# Patient Record
Sex: Female | Born: 1945 | Race: White | Hispanic: No | Marital: Married | Smoking: Former smoker
Health system: Southern US, Community
[De-identification: ages and names within clinical notes are randomized; demographics above are authoritative.]

## PROBLEM LIST (undated history)

## (undated) DIAGNOSIS — G4730 Sleep apnea, unspecified: Secondary | ICD-10-CM

## (undated) DIAGNOSIS — I499 Cardiac arrhythmia, unspecified: Secondary | ICD-10-CM

## (undated) DIAGNOSIS — H3530 Unspecified macular degeneration: Secondary | ICD-10-CM

## (undated) DIAGNOSIS — R519 Headache, unspecified: Secondary | ICD-10-CM

## (undated) DIAGNOSIS — C801 Malignant (primary) neoplasm, unspecified: Secondary | ICD-10-CM

## (undated) DIAGNOSIS — M1990 Unspecified osteoarthritis, unspecified site: Secondary | ICD-10-CM

## (undated) DIAGNOSIS — E0590 Thyrotoxicosis, unspecified without thyrotoxic crisis or storm: Secondary | ICD-10-CM

## (undated) DIAGNOSIS — T7840XA Allergy, unspecified, initial encounter: Secondary | ICD-10-CM

## (undated) DIAGNOSIS — Z8619 Personal history of other infectious and parasitic diseases: Secondary | ICD-10-CM

## (undated) DIAGNOSIS — I10 Essential (primary) hypertension: Secondary | ICD-10-CM

## (undated) DIAGNOSIS — T8859XA Other complications of anesthesia, initial encounter: Secondary | ICD-10-CM

## (undated) HISTORY — PX: MOHS SURGERY: SHX181

## (undated) HISTORY — PX: KNEE ARTHROSCOPY: SUR90

## (undated) HISTORY — DX: Allergy, unspecified, initial encounter: T78.40XA

## (undated) HISTORY — DX: Malignant (primary) neoplasm, unspecified: C80.1

## (undated) HISTORY — PX: DILATION AND CURETTAGE OF UTERUS: SHX78

## (undated) HISTORY — PX: APPENDECTOMY: SHX54

## (undated) HISTORY — PX: TONSILLECTOMY: SUR1361

## (undated) HISTORY — DX: Personal history of other infectious and parasitic diseases: Z86.19

---

## 1999-09-19 ENCOUNTER — Encounter (INDEPENDENT_AMBULATORY_CARE_PROVIDER_SITE_OTHER): Payer: Self-pay

## 1999-09-19 ENCOUNTER — Other Ambulatory Visit: Admission: RE | Admit: 1999-09-19 | Discharge: 1999-09-19 | Payer: Self-pay | Admitting: Gastroenterology

## 2003-07-13 ENCOUNTER — Ambulatory Visit (HOSPITAL_BASED_OUTPATIENT_CLINIC_OR_DEPARTMENT_OTHER): Admission: RE | Admit: 2003-07-13 | Discharge: 2003-07-13 | Payer: Self-pay | Admitting: Plastic Surgery

## 2003-07-13 ENCOUNTER — Encounter (INDEPENDENT_AMBULATORY_CARE_PROVIDER_SITE_OTHER): Payer: Self-pay | Admitting: *Deleted

## 2004-05-25 ENCOUNTER — Emergency Department (HOSPITAL_COMMUNITY): Admission: EM | Admit: 2004-05-25 | Discharge: 2004-05-25 | Payer: Self-pay | Admitting: Family Medicine

## 2008-02-10 ENCOUNTER — Emergency Department (HOSPITAL_COMMUNITY): Admission: EM | Admit: 2008-02-10 | Discharge: 2008-02-10 | Payer: Self-pay | Admitting: Family Medicine

## 2011-04-14 NOTE — Op Note (Signed)
Marissa Parker, Marissa Parker                              ACCOUNT NO.:  [id number]   MEDICAL RECORD NO.:  [id number]                   PATIENT TYPE:  AMB   LOCATION:  DSC                                  FACILITY:  [facility]   PHYSICIAN:  Brantley Persons, M.D.             DATE OF BIRTH:  1946/08/06   DATE OF PROCEDURE:  07/13/2003  DATE OF DISCHARGE:                                 OPERATIVE REPORT   PREOPERATIVE DIAGNOSIS:  Basal cell cancer, left nostril.   POSTOPERATIVE DIAGNOSIS:  Morpheaform basal cell cancer, left nostril.   OPERATION PERFORMED:  Excision of 1.2 cm morpheaform basal cell carcinoma,  left nostril with intraoperative frozen section diagnosis.   SURGEON:  Brantley Persons, M.D.   ANESTHESIA:  1% lidocaine with epinephrine.   COMPLICATIONS:  None.   INDICATIONS FOR PROCEDURE:  The patient is a 65 year old Caucasian female  who was referred by Marissa Parker for re-excision of a left nostril/nasal  alar basal cell cancer.  Marissa Parker performed a biopsy on May 28, 2003.  The  biopsy indicated that there is a basaloid proliferation with atypia that  needs re-excision.  She is therefore brought to the operating room to  undergo re-excision of this lesion with an intraoperative frozen section  diagnosis to check margins.   DESCRIPTION OF PROCEDURE:  The patient was brought to the procedure room and  placed on the table in supine position.  The nose and cheeks were prepped  with Betadine and draped in sterile fashion.  The skin and subcutaneous  tissues in the area of the healing biopsy site were injected with 1%  lidocaine with epinephrine.  After adequate anesthesia and hemostasis had  taken effect, the procedure was begun.  Using loupe magnification, the  borders of what appeared to be the healing biopsy site as well as any  residual basal cell cancer were identified.  At least 1mm margins were  marked circumferentially around this.  The specimen was then excised full  thickness through the skin and subcutaneous tissues and marked at the 12  o'clock position with a suture.  It was then passed off the table to undergo  an intraoperative frozen section diagnosis.  While I was on standby, Marissa Parker reviewed the specimen.  She indicated that basal cell cancer was  present at all the margins and asked for a re-excision of the margins.  I  therefore proceeded with another 2mm circumferential re-excision of all the  margins of the cancer.  A suture marked the 12 o'clock position and the  external margin was inked.  While I was on standby, the specimen was again  sent to the pathology lab for an intraoperative frozen section diagnosis.  Marissa Parker indicated that three of the margins were still involved.  She also  indicated that this was a morpheaform type of basal cell cancer that  actually was spreading in the layer below the epidermis.  I had told her the  skin I was looking at appeared to be normal and uninvolved.  In the course  of our discussion, we discussed how this may be rather extensive for the  patient.  Due to that, I think the most appropriate treatment for the  patient at this point would be to consider Moh's surgery for this lesion  since it is present on her nostril.  After discussing this with the patient,  we agreed that this would be done for her.  I therefore proceeded to close  the wound temporarily while she gets referred to a Moh's surgeon to have the  lesion excised.  We will also see what the final pathology specimens  indicate.  The skin edges were then undermined for easily closure.  The  dermal layer was closed using 5-0 Monocryl suture.  The skin was then closed  with a 6-0 Prolene running baseball type stitch.  Bacitracin ointment was  applied for a dressing.  There  were no complications.  The patient tolerated the procedure well.  She was  then given proper postoperative wound care instructions and discharged to  home in  stable condition.  Follow-up appointment will be tomorrow in the  office.  We will work with the patient to get her referral to a Teacher, early years/pre within her insurance network.                                                Brantley Persons, M.D.    MC/MEDQ  D:  07/13/2003  T:  07/14/2003  Job:  [id number]

## 2015-10-26 ENCOUNTER — Ambulatory Visit (INDEPENDENT_AMBULATORY_CARE_PROVIDER_SITE_OTHER): Payer: Medicare Other | Admitting: Allergy and Immunology

## 2015-10-26 ENCOUNTER — Encounter: Payer: Self-pay | Admitting: Allergy and Immunology

## 2015-10-26 VITALS — BP 160/102 | HR 80 | Temp 97.9°F | Resp 18 | Ht 62.99 in | Wt 163.1 lb

## 2015-10-26 DIAGNOSIS — T7800XA Anaphylactic reaction due to unspecified food, initial encounter: Secondary | ICD-10-CM | POA: Insufficient documentation

## 2015-10-26 DIAGNOSIS — Z91040 Latex allergy status: Secondary | ICD-10-CM | POA: Diagnosis not present

## 2015-10-26 DIAGNOSIS — G43009 Migraine without aura, not intractable, without status migrainosus: Secondary | ICD-10-CM | POA: Diagnosis not present

## 2015-10-26 MED ORDER — EPINEPHRINE 0.3 MG/0.3ML IJ SOAJ: 0.3000 mg | INTRAMUSCULAR | Status: AC | PRN

## 2015-10-26 NOTE — Progress Notes (Addendum)
[place]    NEW PATIENT NOTE  Referring Provider: No ref. provider found Primary Provider: Suzanna Obey, MD    Subjective:   Patient ID: Marissa Parker is a 69 y.o. female with a chief complaint of Pruritis  who presents to the clinic with the following problems:  HPI Comments:  Hand presents to this clinic on 10/26/2015 in evaluation of several issues. First off, she has noticed over the course of the past 26 years that whenever she eats eggs, specifically egg yolks she gets extreme cramping in her abdomen. She can eat baked eggs products with no problem at all but if she eats scrambled eggs or raw eggs in a mixture then she develops this problem. As well, she has noticed that she gets problems with a rash and abdominal cramping after eating banana and avocado. She also has problems with latex glove exposure. When be having a vaginal exam Playtex gloves she and developed intense itching and swelling and when having latex gloves worn by a dentist in her mouth she developed severe itching and swelling at areas of contact. If she wears a bra with elastic contained within it she will develop whelps at areas of contact within several minutes. She also has developed itchiness with using some different types of toilet paper. She can use any kind of scented toilet paper. She she misuses not full toilet paper and she does quite well and doing so. The triple antibiotic ointment is given rise to a rash. 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 She does drink caffeine on occasion. She also appears to have a problem with nasal congestion and sneezing for which she uses Claritin every day which works pretty well.   Past Medical History  Diagnosis Date  . History of shingles     eyes    Past Surgical History  Procedure Laterality Date  .  Tonsillectomy      age 21 and 28  . Mohs surgery      face and leg  . Dilation and curettage of uterus      Outpatient Encounter Prescriptions as of 10/26/2015  Medication Sig  . Ascorbic Acid (VITAMIN C) 500 MG CAPS Take by mouth.  Marland Kitchen aspirin 81 MG tablet Take 81 mg by mouth.  . clidinium-chlordiazePOXIDE (LIBRAX) 5-2.5 MG capsule   . Cyanocobalamin (VITAMIN B 12) 100 MCG LOZG Take by mouth.  . loratadine (CLARITIN) 10 MG tablet Take 10 mg by mouth daily.  . Magnesium Sulfate 70 MG CAPS Take by mouth.  . Potassium 75 MG TABS Take by mouth.  Francella Solian Johns Wort 150 MG CAPS Take by mouth.  . Turmeric 450 MG CAPS Take by mouth.  . vitamin E 400 UNIT capsule Take 400 Units by mouth.  . EPINEPHrine (EPIPEN 2-PAK) 0.3 mg/0.3 mL IJ SOAJ injection Inject 0.3 mLs (0.3 mg total) into the muscle as needed (life-threatening allergic reaction).   No facility-administered encounter medications on file as of 10/26/2015.    Meds ordered this encounter  Medications  . EPINEPHrine (EPIPEN 2-PAK) 0.3 mg/0.3 mL IJ SOAJ injection    Sig: Inject 0.3 mLs (0.3 mg total) into the muscle as needed (life-threatening allergic reaction).    Dispense:  2 Device    Refill:  2    Allergies  Allergen Reactions  . Latex Hives  . Bacitracin-Neomycin-Polymyxin Hives  .  Meperidine Nausea Only    Review of Systems  Constitutional: Negative for fever and chills.  HENT: Negative for congestion, ear pain, facial swelling, nosebleeds, postnasal drip, rhinorrhea, sinus pressure, sneezing, sore throat, tinnitus, trouble swallowing and voice change.   Eyes: Negative for pain, discharge, redness and itching.  Respiratory: Negative for cough, choking, chest tightness, shortness of breath, wheezing and stridor.   Cardiovascular: Negative for chest pain and leg swelling.  Gastrointestinal: Positive for abdominal distention. Negative for nausea, vomiting and abdominal pain.       History of lactose intolerance with the  development of GI cramping and a history of bloating treated with a gluten-free diet for the past 2 years successfully.  Endocrine: Negative for cold intolerance and heat intolerance.  Genitourinary: Negative for difficulty urinating.  Musculoskeletal: Negative.  Negative for myalgias and arthralgias.  Skin: Negative.   Allergic/Immunologic: Negative.   Neurological: Positive for headaches. Negative for dizziness.  Hematological: Negative for adenopathy.    Family History  Problem Relation Age of Onset  . Heart attack Mother   . Colon cancer Father     Social History   Social History  . Marital Status: Married    Spouse Name: N/A  . Number of Children: N/A  . Years of Education: N/A   Occupational History  . Not on file.   Social History Main Topics  . Smoking status: Former Smoker    Quit date: 11/27/1968  . Smokeless tobacco: Never Used  . Alcohol Use: Not on file  . Drug Use: Not on file  . Sexual Activity: Not on file   Other Topics Concern  . Not on file   Social History Narrative  . No narrative on file    Environmental and Social history  Lives in a house with a dry environment, no animals located inside the household, carpeting in the bedroom, sleeping on a stuff mattress without any plastic for the bed or pillows, and no smokers located inside the household. She is a Agricultural engineer.   Objective:   Filed Vitals:   10/26/15 1358  BP: 160/102  Pulse: 80  Temp: 97.9 F (36.6 C)  Resp: 18   Height: 5' 2.99" (160 cm) Weight: 163 lb 2.3 oz (74 kg)  Physical Exam  Constitutional: She appears well-developed and well-nourished. No distress.  HENT:  Head: Normocephalic and atraumatic. Head is without right periorbital erythema and without left periorbital erythema.  Right Ear: Tympanic membrane, external ear and ear canal normal. No drainage or tenderness. No foreign bodies. Tympanic membrane is not injected, not scarred, not perforated, not erythematous, not  retracted and not bulging. No middle ear effusion.  Left Ear: Tympanic membrane, external ear and ear canal normal. No drainage or tenderness. No foreign bodies. Tympanic membrane is not injected, not scarred, not perforated, not erythematous, not retracted and not bulging.  No middle ear effusion.  Nose: Nose normal. No mucosal edema, rhinorrhea, nose lacerations or sinus tenderness.  No foreign bodies.  Mouth/Throat: Oropharynx is clear and moist. No oropharyngeal exudate, posterior oropharyngeal edema, posterior oropharyngeal erythema or tonsillar abscesses.  Eyes: Lids are normal. Right eye exhibits no chemosis, no discharge and no exudate. No foreign body present in the right eye. Left eye exhibits no chemosis, no discharge and no exudate. No foreign body present in the left eye. Right conjunctiva is not injected. Left conjunctiva is not injected.  Neck: Neck supple. No tracheal tenderness present. No tracheal deviation and no edema present. No thyroid mass and no  thyromegaly present.  Cardiovascular: Normal rate, regular rhythm, S1 normal and S2 normal.  Exam reveals no gallop.   No murmur heard. Pulmonary/Chest: No accessory muscle usage or stridor. No respiratory distress. She has no wheezes. She has no rhonchi. She has no rales.  Abdominal: Soft. She exhibits no distension and no mass. There is no tenderness. There is no rebound and no guarding.  Musculoskeletal: She exhibits no edema or tenderness.  Lymphadenopathy:       Head (right side): No tonsillar adenopathy present.       Head (left side): No tonsillar adenopathy present.    She has no cervical adenopathy.  Neurological: She is alert.  Skin: No rash noted. She is not diaphoretic.  Psychiatric: She has a normal mood and affect. Her behavior is normal.    Diagnostics: None   Assessment and Plan:    1. Allergy with anaphylaxis due to food, initial encounter   2. Latex allergy   3. Migraine without aura and without status  migrainosus, not intractable      1. EpiPen, Benadryl, M.D./ER evaluation for allergic reaction  2. Avoid avocado, banana, chest nut, latex, and egg  3. Consolidate all forms of caffeine including chocolate use  4. Continue loratadine 10-20 milligrams daily  5. Contact clinic if problems in the future  And has several active issues revolving around her atopic disease and migraine. Her history is very consistent with the avocado/banana, latex hypersensitivity syndrome and she needs to avoid all of these agents in the future. As well, I did inform her about the overlap of this syndrome with chestnut allergy and she can avoid this food product as well. As well, her history is quite consistent with egg allergy and we'll have her avoid this food product as well. We did give her an EpiPen and informed her about how to use this agent should she ever develop a significant reaction to food in the future. Concerning her migraine headaches, I've informed her that she needs to come off all forms of caffeine and chocolate use as this will probably help the intensity and frequency of her headache. We will see her back in this clinic as needed.    Allena Katz, MD [place]

## 2015-10-26 NOTE — Patient Instructions (Signed)
  1. EpiPen, Benadryl, M.D./ER evaluation for allergic reaction  2. Avoid avocado, banana, chest nut, latex, and egg  3. Consolidate all forms of caffeine including chocolate use\  4. Continue loratadine 10-20 milligrams daily  5. Contact clinic if problems in the future

## 2015-11-23 ENCOUNTER — Encounter: Payer: Self-pay | Admitting: Internal Medicine

## 2016-01-20 ENCOUNTER — Encounter: Payer: Self-pay | Admitting: Internal Medicine

## 2016-01-20 ENCOUNTER — Ambulatory Visit (INDEPENDENT_AMBULATORY_CARE_PROVIDER_SITE_OTHER): Payer: Medicare Other | Admitting: Internal Medicine

## 2016-01-20 VITALS — BP 128/90 | HR 88 | Ht 62.0 in | Wt 162.2 lb

## 2016-01-20 DIAGNOSIS — Z800 Family history of malignant neoplasm of digestive organs: Secondary | ICD-10-CM | POA: Diagnosis not present

## 2016-01-20 DIAGNOSIS — R1084 Generalized abdominal pain: Secondary | ICD-10-CM | POA: Diagnosis not present

## 2016-01-20 DIAGNOSIS — K219 Gastro-esophageal reflux disease without esophagitis: Secondary | ICD-10-CM

## 2016-01-20 MED ORDER — NA SULFATE-K SULFATE-MG SULF 17.5-3.13-1.6 GM/177ML PO SOLN: 1.0000 | Freq: Once | ORAL | Status: DC

## 2016-01-20 NOTE — Progress Notes (Signed)
HISTORY OF PRESENT ILLNESS:  Marissa Parker is a 70 y.o. female , wife of Analiah Goodell, who is referred by her friend (my patient, Velna Ochs) regarding abdominal complaints, reflux complaints, concern over abdominal aneurysm, family history of ulcers, and family history of colon cancer. The patient reports that she was experiencing difficulties with intestinal gas and abdominal discomfort last summer. The issues occurred intermittently. She self treated with probiotics which helped. She has self imposed gluten and Rice restriction which she feels helps her GI tract. However, she will continue to get abdominal discomfort intermittently. Described as lower and cramping. Previously prescribed Librax for irritable bowel type complaints. She does use this on demand with relief. She tells me that her mother had abdominal aortic aneurysm and requests ultrasound. Also her sister with a history of ulcer disease. The patient herself takes daily baby aspirin as well as NSAIDs intermittently but is not on PPI. No upper abdominal complaints except for intermittent GERD symptoms. She does have belching and bloating and gas. Bowel habits have been regular without bleeding. Last colonoscopy in 2000 with Dr. Verl Blalock revealed hyperplastic polyps only. She has been reluctant to undergo follow-up colonoscopy due to bowel prep difficulties. She denies dysphagia. She has had 60 pound weight loss within the past 5 years secondary to [employer].  REVIEW OF SYSTEMS:  All non-GI ROS negative except for back pain, headaches, itching, muscle cramps, muscle pain, skin rash  Past Medical History  Diagnosis Date  . History of shingles     eyes    Past Surgical History  Procedure Laterality Date  . Tonsillectomy      age 89 and 46  . Mohs surgery      face and leg  . Dilation and curettage of uterus      Social History GRACEN BEDIENT  reports that she quit smoking about 47 years ago. She has never used smokeless  tobacco. Her alcohol and drug histories are not on file.  family history includes Aneurysm in her mother; Colon cancer in her father; Heart attack in her mother.  Allergies  Allergen Reactions  . Latex Hives  . Adhesive [Tape]   . Bacitracin-Neomycin-Polymyxin Hives  . Banana   . Gluten Meal   . Lactose Intolerance (Gi)   . Other Hives  . Rice   . Diazepam Nausea Only  . Meperidine Nausea Only       PHYSICAL EXAMINATION: Vital signs: BP 128/90 mmHg  Pulse 88  Ht 5\' 2"  (1.575 m)  Wt 162 lb 3.2 oz (73.573 kg)  BMI 29.66 kg/m2  Constitutional: generally well-appearing, no acute distress Psychiatric: alert and oriented x3, cooperative Eyes: extraocular movements intact, anicteric, conjunctiva pink Mouth: oral pharynx moist, no lesions Neck: supple no lymphadenopathy Cardiovascular: heart regular rate and rhythm, no murmur Lungs: clear to auscultation bilaterally Abdomen: soft, nontender, nondistended, no obvious ascites, no peritoneal signs, normal bowel sounds, no organomegaly. Aorta palpable but no bruit Rectal: Deferred until colonoscopy Extremities: no clubbing cyanosis or lower extremity edema bilaterally Skin: no lesions on visible extremities Neuro: No focal deficits. Cranial nerves intact  ASSESSMENT:  #1. GERD without alarm features #2. Family history of gastric ulcer in patient on chronic NSAIDs #3. Family history of colon cancer in first-degree relative (father at 84); well overdue for follow-up screening #4. Generalized abdominal discomfort and issues with intestinal gas intermittently. Seemingly improved with probiotics and dietary measures #5. Prior history of "IBS". Doubt. Does take Librax periodically for "abdominal spasm discomfort"  PLAN:  #1. Reflux precautions #2. Upper endoscopy to evaluate GERD, abdominal discomfort, and rule out ulcer given chronic NSAID use.The nature of the procedure, as well as the risks, benefits, and alternatives were  carefully and thoroughly reviewed with the patient. Ample time for discussion and questions allowed. The patient understood, was satisfied, and agreed to proceed. #3. Screening colonoscopy.The nature of the procedure, as well as the risks, benefits, and alternatives were carefully and thoroughly reviewed with the patient. Ample time for discussion and questions allowed. The patient understood, was satisfied, and agreed to proceed. #4. Abdominal ultrasound to evaluate abdominal discomfort and rule out aneurysm

## 2016-01-20 NOTE — Patient Instructions (Signed)
You have been scheduled for an abdominal ultrasound at [facility] (1st floor of hospital) on 01/24/2016 at 9:30am. Please arrive 15 minutes prior to your appointment for registration. Make certain not to have anything to eat or drink 6 hours prior to your appointment. Should you need to reschedule your appointment, please contact radiology at [phone]. This test typically takes about 30 minutes to perform.   You have been scheduled for an endoscopy and colonoscopy. Please follow the written instructions given to you at your visit today. Please pick up your prep supplies at the pharmacy within the next 1-3 days. If you use inhalers (even only as needed), please bring them with you on the day of your procedure.

## 2016-01-24 ENCOUNTER — Ambulatory Visit (HOSPITAL_COMMUNITY)
Admission: RE | Admit: 2016-01-24 | Discharge: 2016-01-24 | Disposition: A | Discharge status: Home / Self Care | Payer: Medicare Other | Source: Ambulatory Visit | Attending: Internal Medicine | Admitting: Internal Medicine

## 2016-01-24 DIAGNOSIS — R1084 Generalized abdominal pain: Secondary | ICD-10-CM | POA: Insufficient documentation

## 2016-01-24 DIAGNOSIS — K219 Gastro-esophageal reflux disease without esophagitis: Secondary | ICD-10-CM | POA: Insufficient documentation

## 2016-01-24 DIAGNOSIS — Z800 Family history of malignant neoplasm of digestive organs: Secondary | ICD-10-CM | POA: Diagnosis not present

## 2016-02-24 ENCOUNTER — Ambulatory Visit (AMBULATORY_SURGERY_CENTER): Payer: Medicare Other | Admitting: Internal Medicine

## 2016-02-24 ENCOUNTER — Encounter: Payer: Self-pay | Admitting: Internal Medicine

## 2016-02-24 VITALS — BP 105/65 | HR 69 | Temp 98.7°F | Resp 22 | Ht 62.0 in | Wt 162.0 lb

## 2016-02-24 DIAGNOSIS — K219 Gastro-esophageal reflux disease without esophagitis: Secondary | ICD-10-CM

## 2016-02-24 DIAGNOSIS — Z1211 Encounter for screening for malignant neoplasm of colon: Secondary | ICD-10-CM | POA: Diagnosis not present

## 2016-02-24 DIAGNOSIS — K253 Acute gastric ulcer without hemorrhage or perforation: Secondary | ICD-10-CM

## 2016-02-24 DIAGNOSIS — Z800 Family history of malignant neoplasm of digestive organs: Secondary | ICD-10-CM

## 2016-02-24 MED ORDER — SODIUM CHLORIDE 0.9 % IV SOLN
500.0000 mL | INTRAVENOUS | Status: DC
Start: 2016-02-24 — End: 2016-02-24

## 2016-02-24 NOTE — Op Note (Signed)
[place] Patient Name: Marissa Parker Procedure Date: 02/24/2016 1:46 PM MRN: [id number] Endoscopist: Docia Chuck. Henrene Pastor , MD Age: 70 Referring MD:  Date of Birth: 1946/10/30 Gender: Female Procedure:                Upper GI endoscopy with biopsy Indications:              Suspected esophageal reflux Medicines:                Monitored Anesthesia Care Procedure:                Pre-Anesthesia Assessment:                           - Prior to the procedure, a History and Physical                            was performed, and patient medications and                            allergies were reviewed. The patient's tolerance of                            previous anesthesia was also reviewed. The risks                            and benefits of the procedure and the sedation                            options and risks were discussed with the patient.                            All questions were answered, and informed consent                            was obtained. Prior Anticoagulants: The patient has                            taken no previous anticoagulant or antiplatelet                            agents. ASA Grade Assessment: II - A patient with                            mild systemic disease. After reviewing the risks                            and benefits, the patient was deemed in                            satisfactory condition to undergo the procedure.                           After obtaining informed consent, the endoscope was  passed under direct vision. Throughout the                            procedure, the patient's blood pressure, pulse, and                            oxygen saturations were monitored continuously. The                            Model [id number]-[id number] scope was introduced                            through the mouth, and advanced to the second part                            of duodenum. The upper GI endoscopy was                           accomplished without difficulty. The patient                            tolerated the procedure well. Scope In: Scope Out: Findings:      Non-severe esophagitis was found in the distal esophagus. The esophagus       was otherwise normal.      Patchy mild inflammation was found in the gastric antrum. Biopsies were       taken with a cold forceps for Helicobacter pylori testing using CLOtest.      The exam of the stomach was otherwise normal. Retroflexed view was       negative.      The examined duodenum was normal. Complications:            No immediate complications. Estimated Blood Loss:     Estimated blood loss: none. Impression:               - GERD with mild esophagitis                           - Antral gastritis likely secondary to NSAIDs.                            Status post CLO biopsy. Recommendation:           - Consider Prilosec OTC 20 mg daily for acid reflux                            and protection of gastric mucosa from NSAIDs.                           - Resume previous diet.                           - Continue present medications otherwise. Procedure Code(s):        --- Professional ---                           [phone], Esophagogastroduodenoscopy, flexible,  transoral; with biopsy, single or multiple CPT copyright 2016 American Medical Association. All rights reserved. Docia Chuck. Henrene Pastor, MD 02/24/2016 2:29:22 PM This report has been signed electronically. Number of Addenda: 0 Referring MD:      Suzanna Obey

## 2016-02-24 NOTE — Op Note (Signed)
[place] [place] Patient Name: Marissa Parker Procedure Date: 02/24/2016 1:46 PM MRN: [id number] Endoscopist: Docia Chuck. Henrene Pastor , MD Age: 70 Referring MD:  Date of Birth: 06-15-1946 Gender: Female Procedure:                Colonoscopy Indications:              Screening for colorectal malignant neoplasm.                            Previous examination 2000 (Dr. Sharlett Iles) with                            hyperplastic polyp Medicines:                Monitored Anesthesia Care Procedure:                Pre-Anesthesia Assessment:                           - Prior to the procedure, a History and Physical                            was performed, and patient medications and                            allergies were reviewed. The patient's tolerance of                            previous anesthesia was also reviewed. The risks                            and benefits of the procedure and the sedation                            options and risks were discussed with the patient.                            All questions were answered, and informed consent                            was obtained. Prior Anticoagulants: The patient has                            taken no previous anticoagulant or antiplatelet                            agents. ASA Grade Assessment: II - A patient with                            mild systemic disease. After reviewing the risks                            and benefits, the patient was deemed in  satisfactory condition to undergo the procedure.                           After obtaining informed consent, the colonoscope                            was passed under direct vision. Throughout the                            procedure, the patient's blood pressure, pulse, and                            oxygen saturations were monitored continuously. The                            Model CF-HQ190L [id number]) scope was introduced   through the anus and advanced to the the cecum,                            identified by appendiceal orifice and ileocecal                            valve. The colonoscopy was performed without                            difficulty. The patient tolerated the procedure                            well. The quality of the bowel preparation was                            excellent. The bowel preparation used was SUPREP.                            The ileocecal valve, appendiceal orifice, and                            rectum were photographed. Scope In: 1:55:10 PM Scope Out: 2:06:58 PM Scope Withdrawal Time: 0 hours 8 minutes 40 seconds  Total Procedure Duration: 0 hours 11 minutes 48 seconds  Findings:      The digital rectal exam was normal.      Multiple diverticula were found in the sigmoid colon.      The exam was otherwise without abnormality on direct and retroflexion       views. Complications:            No immediate complications. Estimated Blood Loss:     Estimated blood loss: none. Impression:               - Diverticulosis in the sigmoid colon.                           - The examination was otherwise normal on direct                            and retroflexion views.                           -  No specimens collected. Recommendation:           - Patient has a contact number available for                            emergencies. The signs and symptoms of potential                            delayed complications were discussed with the                            patient. Return to normal activities tomorrow.                            Written discharge instructions were provided to the                            patient.                           - Resume previous diet.                           - Continue present medications.                           - Repeat colonoscopy in 10 years for screening                            purposes. Procedure Code(s):        ---  Professional ---                           [id number], Colonoscopy, flexible; diagnostic, including                            collection of specimen(s) by brushing or washing,                            when performed (separate procedure) CPT copyright 2016 [employer]. All rights reserved. Docia Chuck. Henrene Pastor, MD 02/24/2016 2:21:58 PM This report has been signed electronically. Number of Addenda: 0 CC Letter to:             Dr. Suzanna Obey Referring MD:      Suzanna Obey

## 2016-02-24 NOTE — Progress Notes (Signed)
Notified Dr. Henrene Pastor & CRNA, Tommi Rumps of pt's Egg Yolk allergy.

## 2016-02-24 NOTE — Patient Instructions (Signed)

## 2016-02-24 NOTE — Progress Notes (Signed)
Called to room to assist during endoscopic procedure.  Patient ID and intended procedure confirmed with present staff. Received instructions for my participation in the procedure from the performing physician.  

## 2016-02-24 NOTE — Progress Notes (Signed)
Report given to RN, vss

## 2016-02-25 ENCOUNTER — Telehealth: Payer: Self-pay | Admitting: Emergency Medicine

## 2016-02-25 NOTE — Telephone Encounter (Signed)
  Follow up Call-  Call back number 02/24/2016  Post procedure Call Back phone  # [phone]  Permission to leave phone message Yes     Patient questions:  Do you have a fever, pain , or abdominal swelling? No. Pain Score  0 *  Have you tolerated food without any problems? Yes.    Have you been able to return to your normal activities? Yes.    Do you have any questions about your discharge instructions: Diet   No. Medications  No. Follow up visit  No.  Do you have questions or concerns about your Care? No.  Actions: * If pain score is 4 or above: No action needed, pain <4.

## 2016-02-28 LAB — HELICOBACTER PYLORI SCREEN-BIOPSY: UREASE: NEGATIVE

## 2016-03-14 IMAGING — US US ABDOMEN COMPLETE
1 series · 13 of 25 positions shown · non-contrast
Comparison: None in PACs

CLINICAL DATA: Six months of abdominal pain, evaluate for possible
abdominal aortic aneurysm.

EXAM:
ABDOMEN ULTRASOUND COMPLETE

[Series 1: us abdomen complete · 0.22mm/px · 13 of 70 slices shown]
[im 1/70]
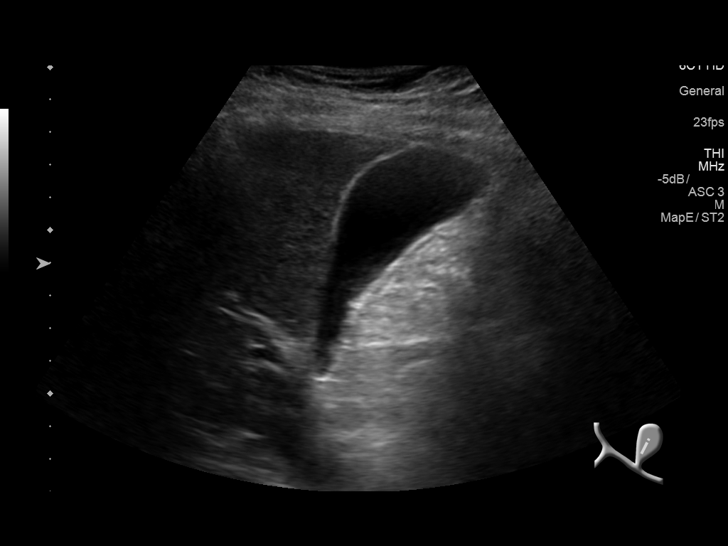
[im 6/70]
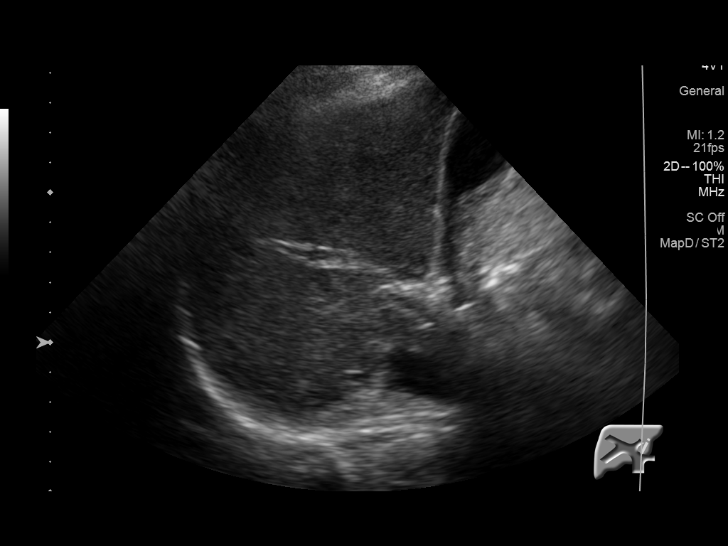
[im 12/70]
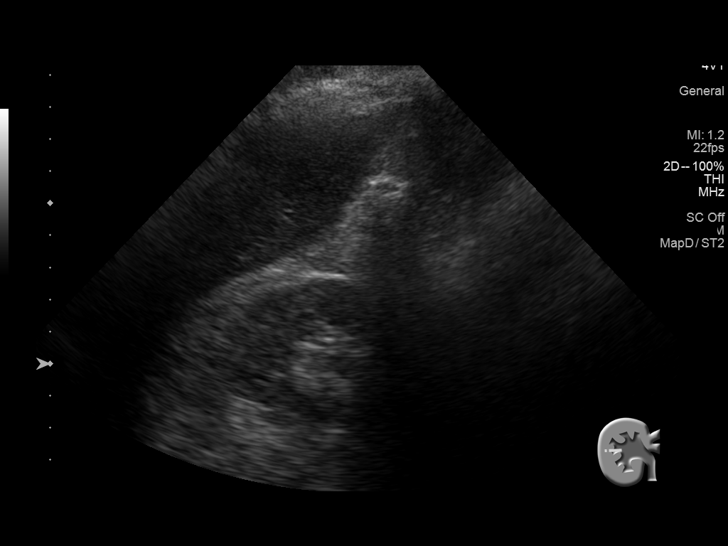
[im 18/70]
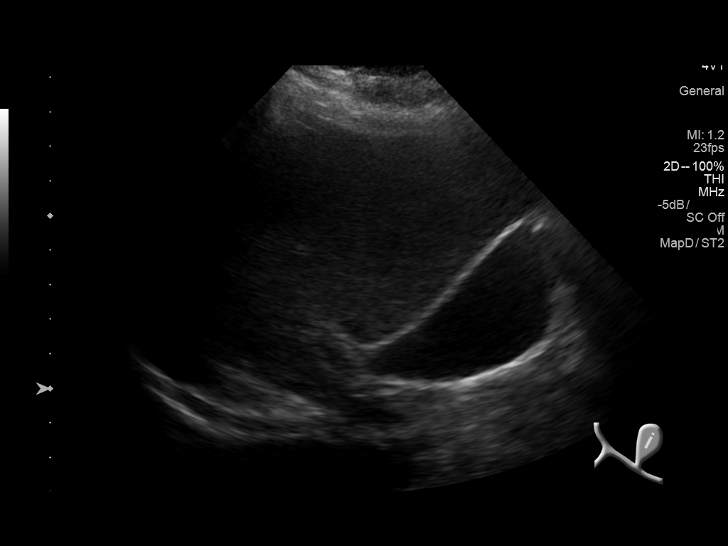
[im 24/70]
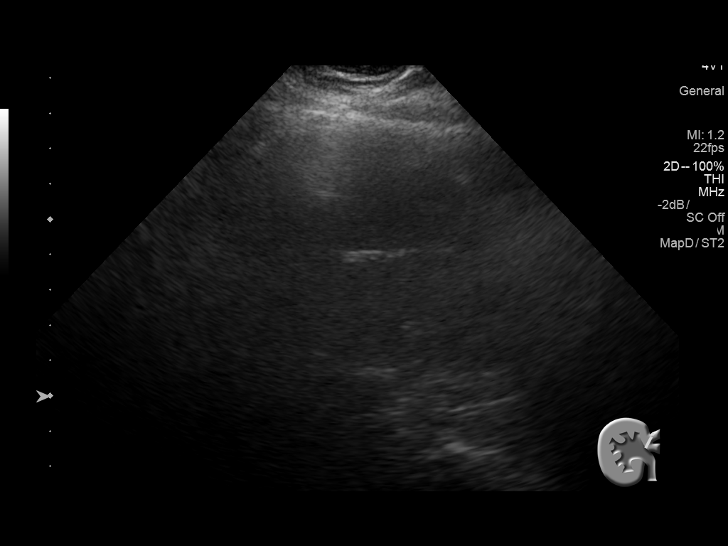
[im 29/70]
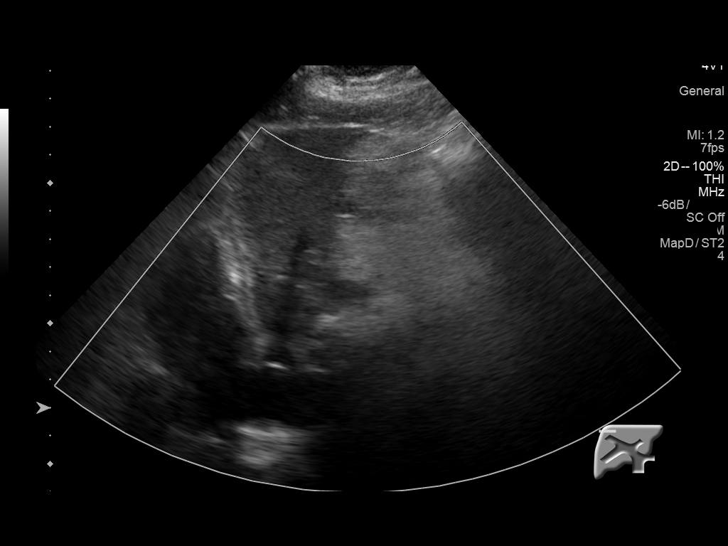
[im 35/70]
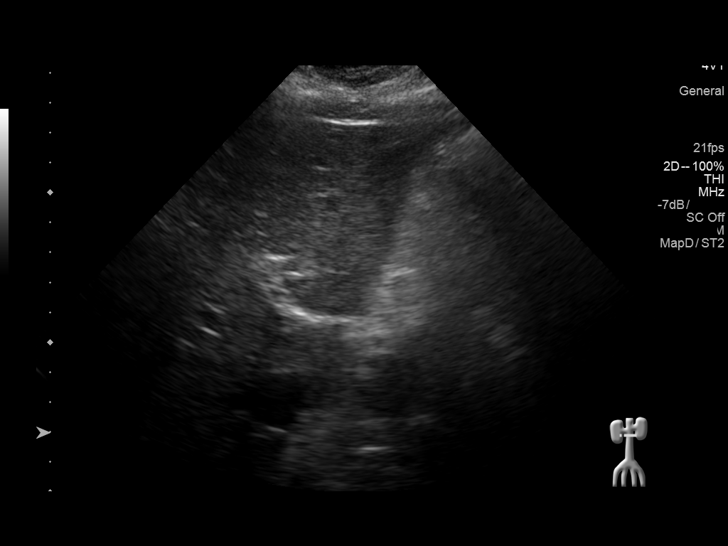
[im 41/70]
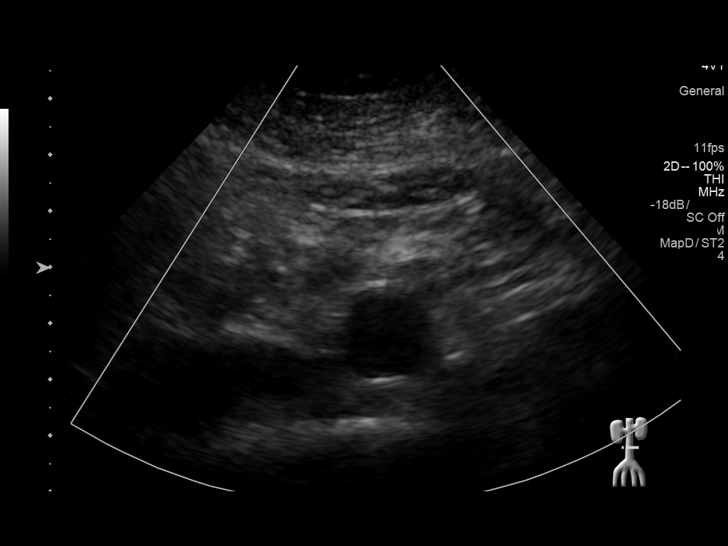
[im 47/70]
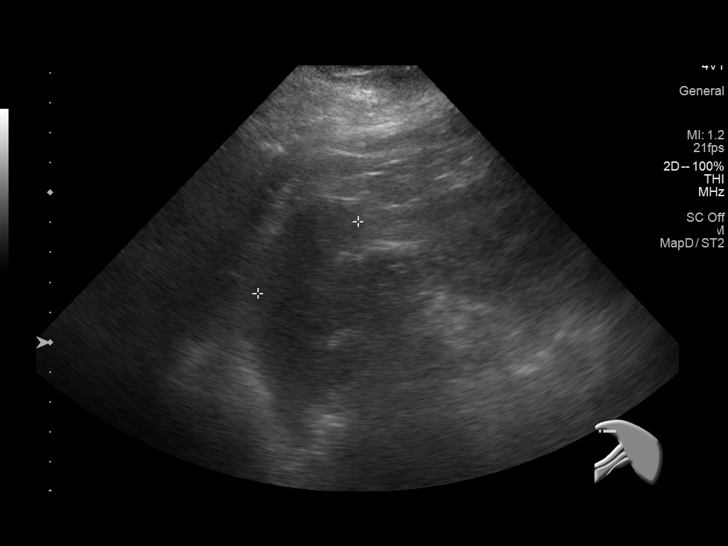
[im 52/70]
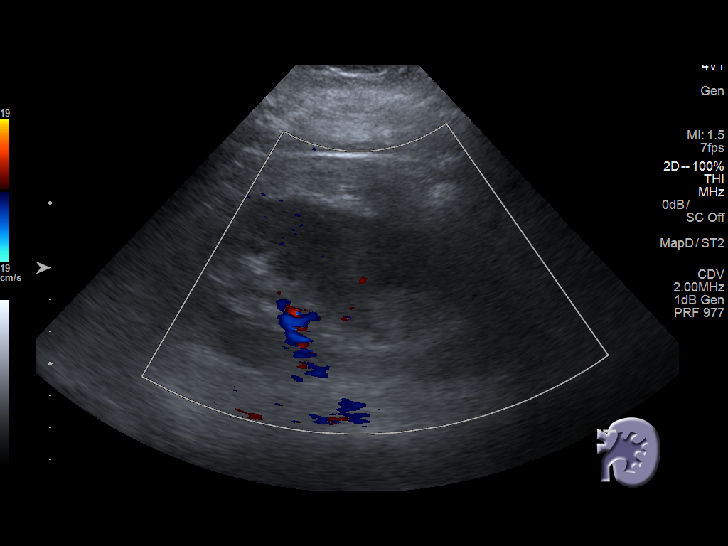
[im 58/70]
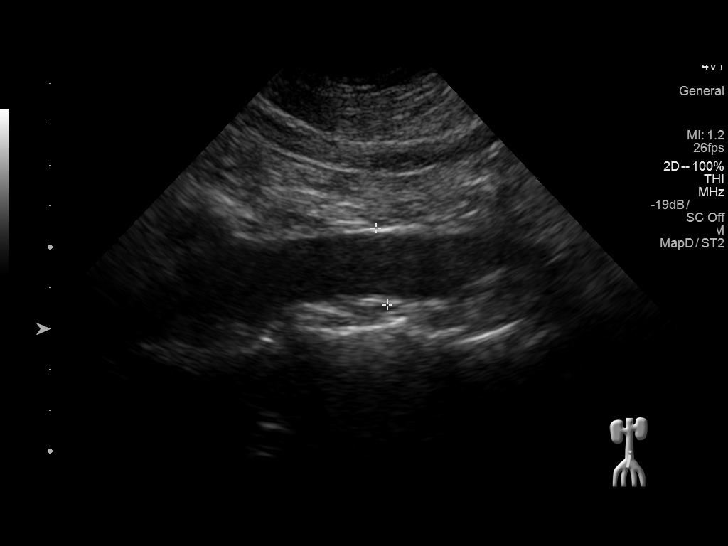
[im 64/70]
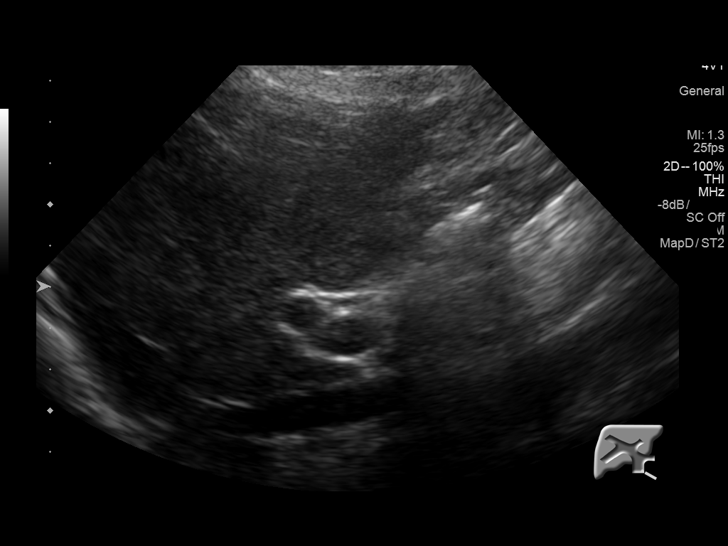
[im 70/70]
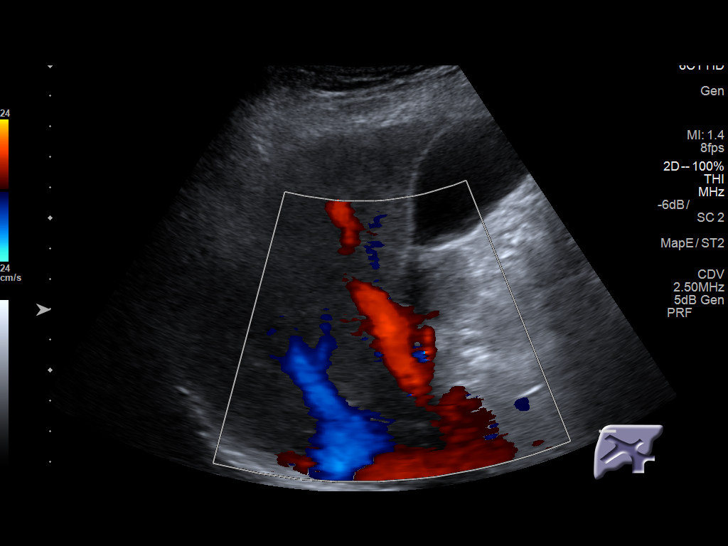

[13 of 25 positions shown; findings below may reference images not displayed]

FINDINGS: The study is limited due to the patient's body habitus and excessive
bowel gas.

Gallbladder: The gallbladder is adequately distended with no
evidence of stones, wall thickening, or pericholecystic fluid. There
is no positive sonographic Murphy's sign.

Common bile duct: Diameter: 3 mm

Liver: The hepatic echotexture is normal. There is no focal mass.
The surface contour is normal. There is no intrahepatic ductal
dilation.

IVC: The visualized portion is unremarkable.

Pancreas: The pancreas was largely obscured by bowel gas. Only a
small portion of the body was observed. Next item the visualized
portions of the spleen are normal.

Spleen: The visualized portions of the spleen are normal.

Right Kidney: Length: 10.4 cm. The renal cortical echotexture is
normal. There is no hydronephrosis nor focal mass.

Left Kidney: Length: 10.7 cm. The left kidney was partially obscured
by bowel gas. The visualized portion is unremarkable..

Abdominal aorta: The abdominal aorta exhibits a normal caliber with
the proximal aortic dimension of 2.2 cm, mid aortic dimension of
cm, and distal aortic dimension of 1.9 cm.

Other findings: None.
IMPRESSION: 1. No acute hepatobiliary abnormality. Limited visualization of the
pancreas and spleen.
2. No acute renal abnormality observed. Visualization of the left
kidney was limited.
3. No abdominal aortic aneurysm is observed.

## 2022-10-03 ENCOUNTER — Encounter (HOSPITAL_COMMUNITY): Payer: Self-pay

## 2022-10-03 DIAGNOSIS — I2699 Other pulmonary embolism without acute cor pulmonale: Secondary | ICD-10-CM

## 2022-10-03 HISTORY — DX: Other pulmonary embolism without acute cor pulmonale: I26.99

## 2022-10-04 ENCOUNTER — Inpatient Hospital Stay (HOSPITAL_COMMUNITY)
Admission: RE | Admit: 2022-10-04 | Discharge: 2022-10-08 | DRG: 643 | Disposition: A | Discharge status: Home / Self Care | Payer: Medicare Other | Source: Other Acute Inpatient Hospital | Attending: Internal Medicine | Admitting: Internal Medicine

## 2022-10-04 DIAGNOSIS — Z87891 Personal history of nicotine dependence: Secondary | ICD-10-CM

## 2022-10-04 DIAGNOSIS — Z7901 Long term (current) use of anticoagulants: Secondary | ICD-10-CM | POA: Diagnosis not present

## 2022-10-04 DIAGNOSIS — F419 Anxiety disorder, unspecified: Secondary | ICD-10-CM | POA: Diagnosis present

## 2022-10-04 DIAGNOSIS — Z91040 Latex allergy status: Secondary | ICD-10-CM

## 2022-10-04 DIAGNOSIS — Z8619 Personal history of other infectious and parasitic diseases: Secondary | ICD-10-CM | POA: Diagnosis not present

## 2022-10-04 DIAGNOSIS — Z888 Allergy status to other drugs, medicaments and biological substances status: Secondary | ICD-10-CM

## 2022-10-04 DIAGNOSIS — Z79899 Other long term (current) drug therapy: Secondary | ICD-10-CM

## 2022-10-04 DIAGNOSIS — E0591 Thyrotoxicosis, unspecified with thyrotoxic crisis or storm: Principal | ICD-10-CM | POA: Diagnosis present

## 2022-10-04 DIAGNOSIS — I2694 Multiple subsegmental pulmonary emboli without acute cor pulmonale: Secondary | ICD-10-CM | POA: Diagnosis not present

## 2022-10-04 DIAGNOSIS — Z91048 Other nonmedicinal substance allergy status: Secondary | ICD-10-CM

## 2022-10-04 DIAGNOSIS — I4891 Unspecified atrial fibrillation: Secondary | ICD-10-CM | POA: Diagnosis not present

## 2022-10-04 DIAGNOSIS — G4733 Obstructive sleep apnea (adult) (pediatric): Secondary | ICD-10-CM | POA: Diagnosis present

## 2022-10-04 DIAGNOSIS — Z91012 Allergy to eggs: Secondary | ICD-10-CM

## 2022-10-04 DIAGNOSIS — Z85828 Personal history of other malignant neoplasm of skin: Secondary | ICD-10-CM

## 2022-10-04 DIAGNOSIS — I4819 Other persistent atrial fibrillation: Secondary | ICD-10-CM

## 2022-10-04 DIAGNOSIS — I2782 Chronic pulmonary embolism: Secondary | ICD-10-CM | POA: Diagnosis present

## 2022-10-04 DIAGNOSIS — I2699 Other pulmonary embolism without acute cor pulmonale: Secondary | ICD-10-CM | POA: Diagnosis not present

## 2022-10-04 DIAGNOSIS — Z8249 Family history of ischemic heart disease and other diseases of the circulatory system: Secondary | ICD-10-CM

## 2022-10-04 DIAGNOSIS — Z91018 Allergy to other foods: Secondary | ICD-10-CM | POA: Diagnosis not present

## 2022-10-04 DIAGNOSIS — E0590 Thyrotoxicosis, unspecified without thyrotoxic crisis or storm: Secondary | ICD-10-CM | POA: Diagnosis not present

## 2022-10-04 DIAGNOSIS — Z800 Family history of malignant neoplasm of digestive organs: Secondary | ICD-10-CM

## 2022-10-04 LAB — COMPREHENSIVE METABOLIC PANEL
ALT: 40 U/L (ref 0–44)
AST: 43 U/L — ABNORMAL HIGH (ref 15–41)
Albumin: 2.5 g/dL — ABNORMAL LOW (ref 3.5–5.0)
Alkaline Phosphatase: 85 U/L (ref 38–126)
Anion gap: 10 (ref 5–15)
BUN: 14 mg/dL (ref 8–23)
CO2: 24 mmol/L (ref 22–32)
Calcium: 9.4 mg/dL (ref 8.9–10.3)
Chloride: 106 mmol/L (ref 98–111)
Creatinine, Ser: 0.45 mg/dL (ref 0.44–1.00)
GFR, Estimated: >60 mL/min (ref >60)
Glucose, Bld: 119 mg/dL — ABNORMAL HIGH (ref 70–99)
Potassium: 3.9 mmol/L (ref 3.5–5.1)
Sodium: 140 mmol/L (ref 135–145)
Total Bilirubin: 0.7 mg/dL (ref 0.3–1.2)
Total Protein: 6.3 g/dL — ABNORMAL LOW (ref 6.5–8.1)

## 2022-10-04 LAB — D-DIMER, QUANTITATIVE: D-Dimer, Quant: 2.81 ug/mL-FEU — ABNORMAL HIGH (ref 0.00–0.50)

## 2022-10-04 LAB — HEPARIN LEVEL (UNFRACTIONATED): Heparin Unfractionated: 0.34 IU/mL (ref 0.30–0.70)

## 2022-10-04 LAB — CBC
HCT: 37.8 % (ref 36.0–46.0)
Hemoglobin: 12.6 g/dL (ref 12.0–15.0)
MCH: 32.7 pg (ref 26.0–34.0)
MCHC: 33.3 g/dL (ref 30.0–36.0)
MCV: 98.2 fL (ref 80.0–100.0)
Platelets: 163 10*3/uL (ref 150–400)
RBC: 3.85 MIL/uL — ABNORMAL LOW (ref 3.87–5.11)
RDW: 12.8 % (ref 11.5–15.5)
WBC: 5.4 10*3/uL (ref 4.0–10.5)
nRBC: 0 % (ref 0.0–0.2)

## 2022-10-04 LAB — TSH: TSH: <0.01 u[IU]/mL — ABNORMAL LOW (ref 0.350–4.500)

## 2022-10-04 LAB — PHOSPHORUS: Phosphorus: 3.7 mg/dL (ref 2.5–4.6)

## 2022-10-04 LAB — GLUCOSE, CAPILLARY
Glucose-Capillary: 134 mg/dL — ABNORMAL HIGH (ref 70–99)
Glucose-Capillary: 137 mg/dL — ABNORMAL HIGH (ref 70–99)

## 2022-10-04 LAB — MAGNESIUM: Magnesium: 1.9 mg/dL (ref 1.7–2.4)

## 2022-10-04 LAB — TROPONIN I (HIGH SENSITIVITY): Troponin I (High Sensitivity): 8 ng/L (ref <18)

## 2022-10-04 LAB — BRAIN NATRIURETIC PEPTIDE: B Natriuretic Peptide: 853.8 pg/mL — ABNORMAL HIGH (ref 0.0–100.0)

## 2022-10-04 LAB — MRSA NEXT GEN BY PCR, NASAL: MRSA by PCR Next Gen: NOT DETECTED

## 2022-10-04 LAB — T4, FREE: Free T4: 5.23 ng/dL — ABNORMAL HIGH (ref 0.61–1.12)

## 2022-10-04 MED ORDER — HEPARIN (PORCINE) 25000 UT/250ML-% IV SOLN: 1400.0000 [IU]/h | INTRAVENOUS | Status: DC

## 2022-10-04 MED ORDER — APIXABAN 5 MG PO TABS: 5.0000 mg | ORAL_TABLET | Freq: Two times a day (BID) | ORAL | Status: DC

## 2022-10-04 MED ORDER — PROPYLTHIOURACIL 50 MG PO TABS
50.0000 mg | ORAL_TABLET | Freq: Three times a day (TID) | ORAL | Status: DC
  Administered 2022-10-04 – 2022-10-07 (×8): 50 mg via ORAL
  Filled 2022-10-04 (×10): qty 1

## 2022-10-04 MED ORDER — METOPROLOL TARTRATE 5 MG/5ML IV SOLN: 2.5000 mg | INTRAVENOUS | Status: DC | PRN

## 2022-10-04 MED ORDER — ACETAMINOPHEN 325 MG PO TABS: 650.0000 mg | ORAL_TABLET | Freq: Four times a day (QID) | ORAL | Status: DC

## 2022-10-04 MED ORDER — HYDROCORTISONE SOD SUC (PF) 100 MG IJ SOLR
100.0000 mg | Freq: Three times a day (TID) | INTRAMUSCULAR | Status: DC
  Administered 2022-10-04 – 2022-10-05 (×3): 100 mg via INTRAVENOUS
  Filled 2022-10-04 (×4): qty 2

## 2022-10-04 MED ORDER — ACETAMINOPHEN 325 MG PO TABS: 650.0000 mg | ORAL_TABLET | Freq: Four times a day (QID) | ORAL | Status: DC | PRN

## 2022-10-04 MED ORDER — IODINE STRONG (LUGOLS) 5 % PO SOLN
0.2000 mL | Freq: Four times a day (QID) | ORAL | Status: DC
  Administered 2022-10-04 – 2022-10-06 (×8): 0.2 mL via ORAL
  Filled 2022-10-04 (×11): qty 0.2

## 2022-10-04 MED ORDER — HEPARIN (PORCINE) 25000 UT/250ML-% IV SOLN
INTRAVENOUS | Status: AC
  Administered 2022-10-04: 1400 [IU]/h via INTRAVENOUS
  Filled 2022-10-04: qty 250

## 2022-10-04 MED ORDER — HEPARIN (PORCINE) 25000 UT/250ML-% IV SOLN: 1400.0000 [IU]/h | INTRAVENOUS | Status: AC

## 2022-10-04 MED ORDER — APIXABAN 5 MG PO TABS
10.0000 mg | ORAL_TABLET | Freq: Two times a day (BID) | ORAL | Status: DC
  Administered 2022-10-04 – 2022-10-08 (×8): 10 mg via ORAL
  Filled 2022-10-04 (×8): qty 2

## 2022-10-04 MED ORDER — PROPRANOLOL HCL 10 MG PO TABS
10.0000 mg | ORAL_TABLET | Freq: Three times a day (TID) | ORAL | Status: DC
  Administered 2022-10-04 – 2022-10-07 (×9): 10 mg via ORAL
  Filled 2022-10-04 (×11): qty 1

## 2022-10-04 NOTE — Progress Notes (Signed)
Pt has home cpap machine and placed herself on for the night.

## 2022-10-04 NOTE — Progress Notes (Addendum)
ANTICOAGULATION CONSULT NOTE - Initial Consult  Pharmacy Consult:  Heparin Indication:  Acute PE and Afib RVR  Allergies  Allergen Reactions   Latex Hives and Itching   Adhesive [Tape] Other (See Comments)    Welts   Avocado Other (See Comments)    Due to latex allergy    Bacitracin-Neomycin-Polymyxin Hives, Itching and Other (See Comments)    Turned skin purple    Banana Other (See Comments)    Due to latex allergy    Egg Yolk Other (See Comments)    Abdominal pain -does not take flu shot due to egg yolk allergy   Glucagon Nausea Only   Gluten Meal Other (See Comments)    Abdominal pain and bloating    Kiwi Extract Other (See Comments)    Due to latex allergy    Lactose Intolerance (Gi) Other (See Comments)    Not allergic per Pt   Propranolol Other (See Comments)    Weakness    Rice Other (See Comments)    Not allergic per Pt   St Johns Wort Other (See Comments)    abd cramping   Diazepam Nausea And Vomiting   Meperidine Nausea Only    Patient Measurements: Height: '5\' 5"'$  (165.1 cm) Weight: 76.7 kg (169 lb) IBW/kg (Calculated) : 57 Heparin Dosing Weight: 76 kg  Vital Signs: Temp: 97.8 F (36.6 C) (11/08 1109) Temp Source: Oral (11/08 1109) Pulse Rate: 117 (11/08 1109)  Labs: No results for input(s): "HGB", "HCT", "PLT", "APTT", "LABPROT", "INR", "HEPARINUNFRC", "HEPRLOWMOCWT", "CREATININE", "CKTOTAL", "CKMB", "TROPONINIHS" in the last 72 hours.  CrCl cannot be calculated (No successful lab value found.).   Medical History: Past Medical History:  Diagnosis Date   Allergy    Cancer ([place])    basil cell ca skin   History of shingles    eyes    Assessment: Marissa Parker presented to OSH with palpitations and SOB in setting of recent adjustment to hyperthyroid medication.  Found to have an acute PE and Afib RVR and start on IV heparin 1400 units/hr prior to transfer to [facility].    OSH labs: hgb 13.2, plts 155, SCr 0.5, d-dimer 3.94  Goal of Therapy:  Heparin  level 0.3-0.7 units/ml Monitor platelets by anticoagulation protocol: Yes   Plan:  Continue heparin infusion at 1400 units/hr Check stat heparin level  Sharone Picchi D. Mina Marble, PharmD, BCPS, [place] 10/04/2022, 11:40 AM  ===============================  Addendum: Heparin level therapeutic at 0.34 units/hr Was off IV heparin for 5-10 min upon transfer into the unit Continue heparin infusion at 1400 units/hr F/U AM labs  Shakai Dolley D. Mina Marble, PharmD, BCPS, [place] 10/04/2022, 12:35 PM

## 2022-10-04 NOTE — Consult Note (Addendum)
Cardiology Consultation   Patient ID: Marissa Parker MRN: [id number]; DOB: June 14, 1946  Admit date: 10/04/2022 Date of Consult: 10/04/2022  PCP:  Suzanna Obey, [place] Prov[facility] Cardiologist:  None        Patient Profile:   Marissa Parker is a 76 y.o. female with a hx of OSA, basal cell carcinoma s/p Mohs procedure, shingles with anisocoria who is being seen 10/04/2022 for the evaluation of new onset atrial fibrillation in the setting of hyperthyroidism at the request of Dr. Elsworth Soho.  History of Present Illness:   Marissa Parker originally presented to [facility] on 11/2 with symptoms of palpitations and shortness of breath and was found to be in afib (unclear if RVR or not). Patient was diagnosed with hyperthyroidism in June of this year. Initial management was with homeopathic regimen but by October, symptoms were worsening. Prescription therapy initiated with propranolol and PTU. Unfortunately, patient experienced significant weakness as a side effect of propranolol and discontinued. She reportedly resumed this at the urging of her PCP around 3 weeks ago after she started experiencing increased palpitations, unclear if atrial fibrillation was noted on any physical exam.   At OSH, CTA chest found multiple emboli in segmental arteries of right lower lobe. Per admission notes, free T4 was 4.10. BNP elevated to 585. Prior to transfer, patient placed on heparin infusion and given PTU and propranolol. She also received hydrocortisone and Lugol's.  On my exam today, patient has respiratory rate 20-26, no significant increase WOB. She denies chest pain or palpitations. Per patient, primary symptoms have been dyspnea and weakness associated with exertion and prolonged talking. She reports intermittent lower extremity swelling, primarily in right leg. Denies recent surgery, travel/prolonged stasis. She is not a smoker.   Past Medical History:  Diagnosis Date   Allergy     Cancer ([place])    basil cell ca skin   History of shingles    eyes    Past Surgical History:  Procedure Laterality Date   APPENDECTOMY     DILATION AND CURETTAGE OF UTERUS     KNEE ARTHROSCOPY     left   MOHS SURGERY     face and leg   TONSILLECTOMY     age 66 and 24     Home Medications:  Prior to Admission medications   Medication Sig Start Date End Date Taking? Authorizing Provider  acetaminophen (TYLENOL) 500 MG tablet Take 1,000 mg by mouth daily as needed for moderate pain.   Yes [provider]  Apoaequorin (PREVAGEN PO) Take 1 tablet by mouth daily.   Yes [provider]  Cholecalciferol (VITAMIN D3 PO) Take 1 capsule by mouth at bedtime.   Yes [provider]  EPINEPHrine (EPIPEN 2-PAK) 0.3 mg/0.3 mL IJ SOAJ injection Inject 0.3 mLs (0.3 mg total) into the muscle as needed (life-threatening allergic reaction). 10/26/15  Yes Kozlow, Donnamarie Poag, MD  fluticasone (FLONASE) 50 MCG/ACT nasal spray Place 1 spray into both nostrils at bedtime.   Yes [provider]  KRILL OIL PO Take 1 capsule by mouth in the morning and at bedtime.   Yes [provider]  MAGNESIUM PO Take 1 tablet by mouth at bedtime.   Yes [provider]  NASAL [facility] NA Place 1 Application into the nose daily.   Yes [provider]  OVER THE COUNTER MEDICATION Take 1 capsule by mouth in the morning and at bedtime. Fruits OTC vitamin   Yes  [provider]  OVER THE COUNTER MEDICATION Take 1 capsule by mouth in the morning and at bedtime. Vegetables OTC vitamin   Yes [provider]  Probiotic Product (PROBIOTIC DAILY PO) Take 1 capsule by mouth daily.   Yes [provider]  propranolol (INDERAL) 10 MG tablet Take 10 mg by mouth 3 (three) times daily. 09/15/22  Yes [provider]  Thiamine HCl (THIAMINE PO) Take 1 capsule by mouth daily.   Yes [provider]  Turmeric 450 MG CAPS Take 450 mg by mouth at  bedtime.   Yes [provider]  VITAMIN K PO Take 1 tablet by mouth at bedtime.   Yes [provider]    Inpatient Medications: Scheduled Meds:  hydrocortisone sod succinate (SOLU-CORTEF) inj  100 mg Intravenous Q8H   Iodine Strong (Lugols)  0.2 mL Oral Q6H   propranolol  10 mg Oral TID   propylthiouracil  50 mg Oral Q8H   Continuous Infusions:  heparin 1,400 Units/hr (10/04/22 1100)   PRN Meds: acetaminophen  Allergies:    Allergies  Allergen Reactions   Latex Hives and Itching   Adhesive [Tape] Other (See Comments)    Welts   Avocado Other (See Comments)    Due to latex allergy    Bacitracin-Neomycin-Polymyxin Hives, Itching and Other (See Comments)    Turned skin purple    Banana Other (See Comments)    Due to latex allergy    Egg Yolk Other (See Comments)    Abdominal pain -does not take flu shot due to egg yolk allergy   Glucagon Nausea Only   Gluten Meal Other (See Comments)    Abdominal pain and bloating    Kiwi Extract Other (See Comments)    Due to latex allergy    Lactose Intolerance (Gi) Other (See Comments)    Not allergic per Pt   Propranolol Other (See Comments)    Weakness    Rice Other (See Comments)    Not allergic per Pt   St Johns Wort Other (See Comments)    abd cramping   Diazepam Nausea And Vomiting   Meperidine Nausea Only    Social History:   Social History   Socioeconomic History   Marital status: Married    Spouse name: Not on file   Number of children: Not on file   Years of education: Not on file   Highest education level: Not on file  Occupational History   Not on file  Tobacco Use   Smoking status: Former    Types: Cigarettes    Quit date: 11/27/1968    Years since quitting: 53.8   Smokeless tobacco: Never  Substance and Sexual Activity   Alcohol use: Yes   Drug use: No   Sexual activity: Not on file  Other Topics Concern   Not on file  Social History Narrative   Not on file   Social Determinants  of Health   Financial Resource Strain: Not on file  Food Insecurity: Not on file  Transportation Needs: Not on file  Physical Activity: Not on file  Stress: Not on file  Social Connections: Not on file  Intimate Partner Violence: Not on file    Family History:    Family History  Problem Relation Age of Onset   Heart attack Mother    Aneurysm Mother    Colon cancer Father      ROS:  Please see the history of present illness.   All other ROS  reviewed and negative.     Physical Exam/Data:   Vitals:   10/04/22 1109 10/04/22 1115 10/04/22 1130 10/04/22 1145  BP:      Pulse: (!) 117 (!) 109 100 (!) 115  Resp: (!) 34 (!) 26 (!) 25 20  Temp: 97.8 F (36.6 C)     TempSrc: Oral     SpO2: 94% 93% 92% 94%  Weight:      Height:        Intake/Output Summary (Last 24 hours) at 10/04/2022 1421 Last data filed at 10/04/2022 1100 Gross per 24 hour  Intake 0.26 ml  Output --  Net 0.26 ml      10/04/2022   10:00 AM 02/24/2016    1:18 PM 01/20/2016    2:45 PM  Last 3 Weights  Weight (lbs) 169 lb 162 lb 162 lb 3.2 oz  Weight (kg) 76.658 kg 73.483 kg 73.573 kg     Body mass index is 28.12 kg/m.  General:  Well nourished, well developed, in no acute distress HEENT: normal Neck: no JVD Vascular: No carotid bruits; Distal pulses 2+ bilaterally Cardiac:  normal S1, S2; irregularly irregular, ventricular rate 100-140bpm; no murmur Lungs:  clear to auscultation bilaterally, no wheezing, rhonchi or rales  Abd: soft, nontender, no hepatomegaly  Ext: no edema Musculoskeletal:  No deformities, BUE and BLE strength normal and equal Skin: warm and dry  Neuro:  CNs 2-12 intact, no focal abnormalities noted Psych:  Normal affect   EKG:  The EKG was personally reviewed and demonstrates:  atrial fibrillation with RVR, ventricular rates in the low 100s.  Telemetry:  Telemetry was personally reviewed and demonstrates:  atrial fibrillation with RVR 110-140 BPM.   Relevant CV  Studies:  N/A  Laboratory Data:  High Sensitivity Troponin:   Recent Labs  Lab 10/04/22 1140  TROPONINIHS 8     Chemistry Recent Labs  Lab 10/04/22 1140  NA 140  K 3.9  CL 106  CO2 24  GLUCOSE 119*  BUN 14  CREATININE 0.45  CALCIUM 9.4  MG 1.9  GFRNONAA >60  ANIONGAP 10    Recent Labs  Lab 10/04/22 1140  PROT 6.3*  ALBUMIN 2.5*  AST 43*  ALT 40  ALKPHOS 85  BILITOT 0.7   Lipids No results for input(s): "CHOL", "TRIG", "HDL", "LABVLDL", "LDLCALC", "CHOLHDL" in the last 168 hours.  Hematology Recent Labs  Lab 10/04/22 1140  WBC 5.4  RBC 3.85*  HGB 12.6  HCT 37.8  MCV 98.2  MCH 32.7  MCHC 33.3  RDW 12.8  PLT 163   Thyroid  Recent Labs  Lab 10/04/22 1140  TSH <0.010*  FREET4 5.23*    BNP Recent Labs  Lab 10/04/22 1140  BNP 853.8*    DDimer  Recent Labs  Lab 10/04/22 1140  DDIMER 2.81*     Radiology/Studies:  No results found.   Assessment and Plan:   New onset atrial fibrillation  Patient with newly discovered atrial fibrillation at OSH following ED visit for dyspnea and palpitations. With concurrent PE as discovered on CTA chest and hyperthyroidism, patient transferred to [facility] for additional management. Total duration of afib is unclear. Patient currently in afib with rates 100s-140s, typically under 120. BNP elevated to 853.8 but no hypervolemia appreciated on physical exam.  Although it is possible that patient hid a clot in RAA appendage causing her PE, overall risk of PE 2/2 to afib is relatively low. Will be important to work up alternative etiologies of PE.  Would consider increasing Propranolol dosing to '20mg'$  TID if tolerated by BP.  Agree with PRN Lopressor IV pushes If significant and symptomatic RVR were to develop, would recommend avoiding Amiodarone due to thyroid/iodine interaction. Diltiazem or Digoxin would be preferred agents. Continue thyroid management per primary team. True rhythm control will be challenging in  the setting of hyperthyroidism/thyroid storm. If afib persists after optimized thyroid management, should consider DCCV after a minimum of 3 weeks of anticoagulation. On OSH obtained earlier this year, patient had normal sized atria, likely has a good chance of maintaining NSR assuming adequate thyroid management. Patient currently on heparin, assuming no plans for intervention, suggest transition to Eliquis '10mg'$  BID x7 days then '5mg'$  BID.     Hyperthyroidism  Patient with undetectable TSH, free T4 of 4.10 at OSH. T3 pending, T4 free 5.23. Patient appears to be strongly against thyroidectomy at this time.   Continue PTU per primary team. Continue Propranolol '10mg'$  TID.   Pulmonary Embolism  CTA chest at OSH notable for multiple emboli in segmental arteries of right lower lobe. Patient without evidence of respiratory distress or hemodynamic compromise. TTE and lower extremity dopplers pending. Patient currently on heparin, assuming no plans for intervention, suggest Eliquis '10mg'$  BID x7 days then '5mg'$  BID.   OSA  Patient reports nightly use of CPAP. Continue while inpatient.    Risk Assessment/Risk Scores:          CHA2DS2-VASc Score = 3   This indicates a 3.2% annual risk of stroke. The patient's score is based upon: CHF History: 0 HTN History: 0 Diabetes History: 0 Stroke History: 0 Vascular Disease History: 0 Age Score: 2 Gender Score: 1      For questions or updates, please contact [place] Please consult [email or web address] for contact info under    Signed, Marissa Kocher, Marissa Parker  10/04/2022 2:21 PM  Patient seen and examined with Marissa Kocher Marissa Parker.  Agree as above, with the following exceptions and changes as noted below.  Patient is a 76 year old female who does not reside in [place] who presents for further management of hyperthyroidism, pulmonary embolism, and atrial fibrillation with rapid ventricular response in the setting of medical illness.  Fortunately  she is asymptomatic and does not experience any palpitations at this time.  She has previously not tolerated propranolol but now while on PTU seems to be tolerating propranolol 3 times daily reasonably well.  No known provoking factors for pulmonary embolism.  Lower extremity venous Dopplers are pending.  She had been given diltiazem yesterday for atrial fibrillation but experienced hypotension and this was discontinued.  Gen: NAD, CV: irregular and tachycardic, no murmurs, Lungs: clear, Abd: soft, Extrem: Warm, well perfused, no edema, Neuro/Psych: alert and oriented x 3, normal mood and affect. All available labs, radiology testing, previous records reviewed.  He is warm and dry on exam and is in atrial fibrillation with rapid ventricular response with an average rate of approximately 125 to 130 bpm.  Fortunately she is hemodynamically stable.  Could consider uptitrating propranolol if patient tolerates for further rate control.  Otherwise could retrial diltiazem infusion, though patient states it made her hypotensive.  We could consider an oral regimen of diltiazem as well to supplement her beta-blockade.  At this time we will want to avoid amiodarone in the setting of thyroid disease.  Patient is not in favor of planning for cardioversion, and this is reasonable at this time given uncontrolled provoking factors.  Consider transition to [place], currently on heparin infusion.  Elouise Munroe, MD 10/04/22 4:33 PM

## 2022-10-04 NOTE — H&P (Signed)
NAME:  Marissa Parker, MRN:  [id number], DOB:  [birth year], LOS: 0 ADMISSION DATE:  10/04/2022, CONSULTATION DATE:  11/7 REFERRING MD:  Wallie Renshaw, CHIEF COMPLAINT:  thyroid storm, new onset afib   History of Present Illness:  Marissa Parker is an 76 y.o. F who presented to [facility] as a transfer from [facility] with concern for thyroid storm, new onset a-fib  She has a pertinent past medical history of OSA, basal cell carcinoma s/p mohs procedure, shingles with ansisocoria   Patient diagnosed with hyperthyroidism in June of 2023 by PCP after patient asked for a thyorid panel due to concern about her gaining weight. She was found to have an elevated t3/t4. She wanted to treat this with herbal/supplements and was trialed on this plan. Her symptoms worsened in October 2023. She was started on propranolol and PTU, however she did not tolerate the propranalol (weakness) and stopped taking it.  Around 11/1-2 she developed worsening palpitations and shortness of breath. She presented to  [facility] She was found to be in new onset Afib, CTA demonstrated multiple emboli in the segmental arteries of the right lower lobe, TSH below measurable limits, T4 free 4.10, Bnp 585. She was started on a heparin GTT, PTU, propranolol. '300mg'$  of hydrocortisone were given and Lugol's solution was started.   Transfer was initiated and PCCM accepted for admission.   Pertinent  Medical History  OSA, basal cell carcinoma s/p mohs procedure, shingles with [place] [facility] Events: Including procedures, antibiotic start and stop dates in addition to other pertinent events   11/7 presented to OSH ED 11/8 TXR to [facility]  Interim History / Subjective:  See above  Denies chest pain, SOB  Objective   Blood pressure 129/77, pulse (!) 115, temperature 97.8 F (36.6 C), temperature source Oral, resp. rate 20, height '5\' 5"'$  (1.651 m), weight 76.7 kg, SpO2 94 %.        Intake/Output  Summary (Last 24 hours) at 10/04/2022 1234 Last data filed at 10/04/2022 1100 Gross per 24 hour  Intake 0.26 ml  Output --  Net 0.26 ml   Filed Weights   10/04/22 1000  Weight: 76.7 kg    Examination: General: In bed, NAD, appears comfortable HEENT: MM pink/moist, anicteric, atraumatic Neuro: RASS 0, Pupils unequal L>R, 17m L, 3MM R, reactive, GCS 15 CV: S1S2, Afib, no m/r/g appreciated PULM:  clear in the upper lobes, clear in the lower lobes, trachea midline, chest expansion symmetric GI: soft, bsx4 active, non-tender   Extremities: warm/dry, no pretibial edema, capillary refill less than 3 seconds  Skin:  no rashes or lesions noted  Labs per transfer documentation WBC 6.9 HCT 39 PLT 155 Troponin WNL TSH below measurable limits, T4 free 4.10, Bnp 585.   CTA demonstrated multiple emboli in the segmental arteries of the right lower lobe,  Resolved Hospital Problem list     Assessment & Plan:  ?Thyroid storm HX hyperthyroidism. TSH below measurable limits, T4 free 4.10. Patient adamant that she does not thyroid removed at this time. No leukocytosis, no sick contacts. Afebrile. No neuro changes -Continue propranolol '10mg'$  TID -Continue PTU '50mg'$  TID -Given '300mg'$  hydrocortisone in ED. Continue '100mg'$  q8h -Continue lugols solution -Will monitor in ICU for next few hours and consider [facility] to [facility] in afternoon depending on clinical course. Will need further evaluation/consultation regarding hypothyroidism   Pulmonary embolus CTA demonstrated multiple emboli in the segmental arteries of the right lower lobe. S-PESI: high  risk. HX of basal cell ca, HR in 120's, on room air, and normotensive. Patient with no hx of clots, likely from untreated afib, patient with some weakness and decreased activity over the past few weeks but endorses moving around -Continue heparin GTT -Continuous SPO2 monitoring and tele  New onset a fib Suspect secondary to hyperthyroidism vs PE.  Given dilt at OSH and became hypotensive. Unclear how long patient has been in afib. -Continuous telemetry -If develops RVR, will start amio gtt -obtain ECHO -Check electryoytes  OSA -CPAP QhS  Best Practice (right click and "Reselect all SmartList Selections" daily)   Diet/type: Regular consistency (see orders) DVT prophylaxis: systemic heparin GI prophylaxis: N/A Lines: N/A Foley:  N/A Code Status:  full code Last date of multidisciplinary goals of care discussion [pending]  Labs   CBC: Recent Labs  Lab 10/04/22 1140  WBC 5.4  HGB 12.6  HCT 37.8  MCV 98.2  PLT 962    Basic Metabolic Panel: No results for input(s): "NA", "K", "CL", "CO2", "GLUCOSE", "BUN", "CREATININE", "CALCIUM", "MG", "PHOS" in the last 168 hours. GFR: CrCl cannot be calculated (No successful lab value found.). Recent Labs  Lab 10/04/22 1140  WBC 5.4    Liver Function Tests: No results for input(s): "AST", "ALT", "ALKPHOS", "BILITOT", "PROT", "ALBUMIN" in the last 168 hours. No results for input(s): "LIPASE", "AMYLASE" in the last 168 hours. No results for input(s): "AMMONIA" in the last 168 hours.  ABG No results found for: "PHART", "PCO2ART", "PO2ART", "HCO3", "TCO2", "ACIDBASEDEF", "O2SAT"   Coagulation Profile: No results for input(s): "INR", "PROTIME" in the last 168 hours.  Cardiac Enzymes: No results for input(s): "CKTOTAL", "CKMB", "CKMBINDEX", "TROPONINI" in the last 168 hours.  HbA1C: No results found for: "HGBA1C"  CBG: No results for input(s): "GLUCAP" in the last 168 hours.  Review of Systems:   Positives in bold  Gen: fever, chills, weight change, fatigue, night sweats HEENT:  blurred vision, double vision, hearing loss, tinnitus, sinus congestion, rhinorrhea, sore throat, neck stiffness, dysphagia PULM:  shortness of breath, cough, sputum production, hemoptysis, wheezing CV: chest pain, edema, orthopnea, paroxysmal nocturnal dyspnea, palpitations GI:  abdominal  pain, nausea, vomiting, diarrhea, hematochezia, melena, constipation, change in bowel habits GU: dysuria, hematuria, polyuria, oliguria, urethral discharge Endocrine: hot or cold intolerance, polyuria, polyphagia or appetite change Derm: rash, dry skin, scaling or peeling skin change Heme: easy bruising, bleeding, bleeding gums Neuro: headache, numbness, weakness, slurred speech, loss of memory or consciousness   Past Medical History:  She,  has a past medical history of Allergy, Cancer ([place]), and History of shingles.   Surgical History:   Past Surgical History:  Procedure Laterality Date   APPENDECTOMY     DILATION AND CURETTAGE OF UTERUS     KNEE ARTHROSCOPY     left   MOHS SURGERY     face and leg   TONSILLECTOMY     age 68 and 58     Social History:   reports that she quit smoking about 53 years ago. She has never used smokeless tobacco. She reports current alcohol use. She reports that she does not use drugs.   Family History:  Her family history includes Aneurysm in her mother; Colon cancer in her father; Heart attack in her mother.   Allergies Allergies  Allergen Reactions   Latex Hives and Itching   Adhesive [Tape] Other (See Comments)    Welts   Avocado Other (See Comments)    Due to latex allergy  Bacitracin-Neomycin-Polymyxin Hives, Itching and Other (See Comments)    Turned skin purple    Banana Other (See Comments)    Due to latex allergy    Egg Yolk Other (See Comments)    Abdominal pain -does not take flu shot due to egg yolk allergy   Glucagon Nausea Only   Gluten Meal Other (See Comments)    Abdominal pain and bloating    Kiwi Extract Other (See Comments)    Due to latex allergy    Lactose Intolerance (Gi) Other (See Comments)    Not allergic per Pt   Propranolol Other (See Comments)    Weakness    Rice Other (See Comments)    Not allergic per Pt   [place] Wort Other (See Comments)    abd cramping   Diazepam Nausea And Vomiting    Meperidine Nausea Only     Home Medications  Prior to Admission medications   Medication Sig Start Date End Date Taking? Authorizing Provider  acetaminophen (TYLENOL) 500 MG tablet Take 1,000 mg by mouth daily as needed for moderate pain.   Yes [provider]  Cholecalciferol (VITAMIN D3 PO) Take 1 capsule by mouth at bedtime.   Yes [provider]  EPINEPHrine (EPIPEN 2-PAK) 0.3 mg/0.3 mL IJ SOAJ injection Inject 0.3 mLs (0.3 mg total) into the muscle as needed (life-threatening allergic reaction). 10/26/15  Yes Kozlow, Donnamarie Poag, MD  fluticasone (FLONASE) 50 MCG/ACT nasal spray Place 1 spray into both nostrils at bedtime.   Yes [provider]  KRILL OIL PO Take 1 capsule by mouth in the morning and at bedtime.   Yes [provider]  NASAL [facility] NA Place 1 Application into the nose daily.   Yes [provider]  Probiotic Product (PROBIOTIC DAILY PO) Take 1 capsule by mouth daily.   Yes [provider]  propranolol (INDERAL) 10 MG tablet Take 10 mg by mouth 3 (three) times daily. 09/15/22  Yes [provider]  Thiamine HCl (THIAMINE PO) Take 1 capsule by mouth daily.   Yes [provider]  Turmeric 450 MG CAPS Take 450 mg by mouth at bedtime.   Yes [provider]  clidinium-chlordiazePOXIDE Juluis Pitch) 5-2.5 MG capsule  09/16/15   [provider]     Critical care time: n/a    [employer]., MSN, APRN, AGACNP-BC [facility]  10/04/2022 , 12:47 PM  Please see [email or web address] for pager details  If no response, please call [phone] After hours, please call Elink at [phone]

## 2022-10-05 ENCOUNTER — Other Ambulatory Visit (HOSPITAL_COMMUNITY): Payer: Self-pay

## 2022-10-05 ENCOUNTER — Inpatient Hospital Stay (HOSPITAL_COMMUNITY): Payer: Medicare Other

## 2022-10-05 ENCOUNTER — Other Ambulatory Visit (HOSPITAL_COMMUNITY): Payer: Medicare Other

## 2022-10-05 ENCOUNTER — Telehealth (HOSPITAL_COMMUNITY): Payer: Self-pay | Admitting: Pharmacy Technician

## 2022-10-05 DIAGNOSIS — E0591 Thyrotoxicosis, unspecified with thyrotoxic crisis or storm: Secondary | ICD-10-CM | POA: Diagnosis not present

## 2022-10-05 DIAGNOSIS — G4733 Obstructive sleep apnea (adult) (pediatric): Secondary | ICD-10-CM | POA: Diagnosis not present

## 2022-10-05 DIAGNOSIS — I4891 Unspecified atrial fibrillation: Secondary | ICD-10-CM | POA: Diagnosis not present

## 2022-10-05 DIAGNOSIS — I2699 Other pulmonary embolism without acute cor pulmonale: Secondary | ICD-10-CM | POA: Diagnosis not present

## 2022-10-05 LAB — CBC
HCT: 37.4 % (ref 36.0–46.0)
Hemoglobin: 12.7 g/dL (ref 12.0–15.0)
MCH: 33.2 pg (ref 26.0–34.0)
MCHC: 34 g/dL (ref 30.0–36.0)
MCV: 97.7 fL (ref 80.0–100.0)
Platelets: 166 10*3/uL (ref 150–400)
RBC: 3.83 MIL/uL — ABNORMAL LOW (ref 3.87–5.11)
RDW: 12.9 % (ref 11.5–15.5)
WBC: 6.3 10*3/uL (ref 4.0–10.5)
nRBC: 0 % (ref 0.0–0.2)

## 2022-10-05 LAB — GLUCOSE, CAPILLARY
Glucose-Capillary: 152 mg/dL — ABNORMAL HIGH (ref 70–99)
Glucose-Capillary: 117 mg/dL — ABNORMAL HIGH (ref 70–99)
Glucose-Capillary: 152 mg/dL — ABNORMAL HIGH (ref 70–99)
Glucose-Capillary: 97 mg/dL (ref 70–99)

## 2022-10-05 LAB — BASIC METABOLIC PANEL
Anion gap: 7 (ref 5–15)
BUN: 17 mg/dL (ref 8–23)
CO2: 25 mmol/L (ref 22–32)
Calcium: 9.4 mg/dL (ref 8.9–10.3)
Chloride: 109 mmol/L (ref 98–111)
Creatinine, Ser: 0.51 mg/dL (ref 0.44–1.00)
GFR, Estimated: >60 mL/min (ref >60)
Glucose, Bld: 123 mg/dL — ABNORMAL HIGH (ref 70–99)
Potassium: 4.2 mmol/L (ref 3.5–5.1)
Sodium: 141 mmol/L (ref 135–145)

## 2022-10-05 LAB — PHOSPHORUS: Phosphorus: 4.5 mg/dL (ref 2.5–4.6)

## 2022-10-05 LAB — MAGNESIUM: Magnesium: 2.1 mg/dL (ref 1.7–2.4)

## 2022-10-05 LAB — T3, FREE: T3, Free: 6.2 pg/mL — ABNORMAL HIGH (ref 2.0–4.4)

## 2022-10-05 MED ORDER — DILTIAZEM HCL-DEXTROSE 125-5 MG/125ML-% IV SOLN (PREMIX)
5.0000 mg/h | INTRAVENOUS | Status: DC
  Administered 2022-10-05: 5 mg/h via INTRAVENOUS
  Filled 2022-10-05: qty 125

## 2022-10-05 MED ORDER — HYDROCORTISONE SOD SUC (PF) 100 MG IJ SOLR
50.0000 mg | Freq: Three times a day (TID) | INTRAMUSCULAR | Status: DC
  Administered 2022-10-05 – 2022-10-06 (×2): 50 mg via INTRAVENOUS
  Filled 2022-10-05 (×4): qty 1

## 2022-10-05 NOTE — Progress Notes (Signed)
PROGRESS NOTE        PATIENT DETAILS Name: Marissa Parker Age: 76 y.o. Sex: female Date of Birth: 12/14/1945 Admit Date: 10/04/2022 Admitting Physician Candee Furbish, MD [id number], Annie Main, MD  Brief Summary: Patient is a 76 y.o.  female with known history of hyperthyroidism (diagnosed June 2023-wanted to try natural remedies-apparently did not tolerate PTU/propanolol subsequently)-admitted to the hospital with thyrotoxicosis and A-fib RVR, pulmonary embolism.    Significant events: 11/7>> presented to outside hospital ED with palpitations/SOB-found to have A-fib RVR, PE-concern for thyroid storm.  Given steroids, PTU, low-dose iodine. 11/8>> [facility] for thyroid storm.  Admit to ICU briefly by PCCM. 11/9>> transfer to [facility].  Significant studies: 11/8>> TSH:<0.010 11/8>> FT4: 5.23  Significant microbiology data: None  Procedures: None  Consults: None  Subjective: Seems anxious-heart rate varying in the 120s-140s range.  Appears comfortable  Objective: Vitals: Blood pressure 123/71, pulse (!) 139, temperature 98.7 F (37.1 C), temperature source Oral, resp. rate (!) 27, height '5\' 5"'$  (1.651 m), weight 76.7 kg, SpO2 92 %.   Exam: Gen Exam:Alert awake-not in any distress HEENT:atraumatic, normocephalic Chest: B/L clear to auscultation anteriorly CVS:S1S2 regular Abdomen:soft non tender, non distended Extremities:no edema Neurology: Non focal Skin: no rash  Pertinent Labs/Radiology:    Latest Ref Rng & Units 10/05/2022    4:53 AM 10/04/2022   11:40 AM  CBC  WBC 4.0 - 10.5 K/uL 6.3  5.4   Hemoglobin 12.0 - 15.0 g/dL 12.7  12.6   Hematocrit 36.0 - 46.0 % 37.4  37.8   Platelets 150 - 400 K/uL 166  163     Lab Results  Component Value Date   NA 141 10/05/2022   K 4.2 10/05/2022   CL 109 10/05/2022   CO2 25 10/05/2022      Assessment/Plan: Thyrotoxicosis Doubt thyroid storm Begin  hydrocortisone taper For now continue with PTU-will switch to methimazole prior to discharge We will need outpatient endocrine follow-up   New onset A-fib RVR Rates remain elevated on propanolol Cardiology starting Cardizem gtt. On Eliquis  Pulmonary embolism Appears unprovoked Await Dopplers/echo On Eliquis Likely needs indefinite anticoagulation  OSA CPAP qhs  Anxiety Suspect this may be related to thyrotoxicosis Apparently got Valium at [facility] does not want to try benzos again. Hopefully with better control of thyrotoxicosis over the next few days-anxiety will improve.  BMI: Estimated body mass index is 28.12 kg/m as calculated from the following:   Height as of this encounter: '5\' 5"'$  (1.651 m).   Weight as of this encounter: 76.7 kg.   Code status:   Code Status: Not on file   DVT Prophylaxis: apixaban (ELIQUIS) tablet 10 mg  apixaban (ELIQUIS) tablet 5 mg     Family Communication: None at bedside   Disposition Plan: Status is: Inpatient Remains inpatient appropriate because: A-fib RVR-thyrotoxicosis-not yet stable for discharge.   Planned Discharge Destination:Home   Diet: Diet Order             Diet regular Room service appropriate? Yes; Fluid consistency: Thin  Diet effective now                     Antimicrobial agents: Anti-infectives (From admission, onward)    None        MEDICATIONS: Scheduled Meds:  apixaban  10 mg  Oral BID   Followed by   Derrill Memo ON 10/11/2022] apixaban  5 mg Oral BID   hydrocortisone sod succinate (SOLU-CORTEF) inj  50 mg Intravenous Q8H   Iodine Strong (Lugols)  0.2 mL Oral Q6H   propranolol  10 mg Oral TID   propylthiouracil  50 mg Oral Q8H   Continuous Infusions:  diltiazem (CARDIZEM) infusion     PRN Meds:.acetaminophen, metoprolol tartrate   I have personally reviewed following labs and imaging studies  LABORATORY DATA: CBC: Recent Labs  Lab 10/04/22 1140 10/05/22 0453  WBC 5.4 6.3  HGB  12.6 12.7  HCT 37.8 37.4  MCV 98.2 97.7  PLT 163 809    Basic Metabolic Panel: Recent Labs  Lab 10/04/22 1140 10/05/22 0453  NA 140 141  K 3.9 4.2  CL 106 109  CO2 24 25  GLUCOSE 119* 123*  BUN 14 17  CREATININE 0.45 0.51  CALCIUM 9.4 9.4  MG 1.9 2.1  PHOS 3.7 4.5    GFR: Estimated Creatinine Clearance: 61.3 mL/min (by C-G formula based on SCr of 0.51 mg/dL).  Liver Function Tests: Recent Labs  Lab 10/04/22 1140  AST 43*  ALT 40  ALKPHOS 85  BILITOT 0.7  PROT 6.3*  ALBUMIN 2.5*   No results for input(s): "LIPASE", "AMYLASE" in the last 168 hours. No results for input(s): "AMMONIA" in the last 168 hours.  Coagulation Profile: No results for input(s): "INR", "PROTIME" in the last 168 hours.  Cardiac Enzymes: No results for input(s): "CKTOTAL", "CKMB", "CKMBINDEX", "TROPONINI" in the last 168 hours.  BNP (last 3 results) No results for input(s): "PROBNP" in the last 8760 hours.  Lipid Profile: No results for input(s): "CHOL", "HDL", "LDLCALC", "TRIG", "CHOLHDL", "LDLDIRECT" in the last 72 hours.  Thyroid Function Tests: Recent Labs    10/04/22 1140  TSH <0.010*  FREET4 5.23*  T3FREE 6.2*    Anemia Panel: No results for input(s): "VITAMINB12", "FOLATE", "FERRITIN", "TIBC", "IRON", "RETICCTPCT" in the last 72 hours.  Urine analysis: No results found for: "COLORURINE", "APPEARANCEUR", "LABSPEC", "PHURINE", "GLUCOSEU", "HGBUR", "BILIRUBINUR", "KETONESUR", "PROTEINUR", "UROBILINOGEN", "NITRITE", "LEUKOCYTESUR"  Sepsis Labs: Lactic Acid, Venous No results found for: "LATICACIDVEN"  MICROBIOLOGY: Recent Results (from the past 240 hour(s))  MRSA Next Gen by PCR, Nasal     Status: None   Collection Time: 10/04/22 11:05 AM   Specimen: Nasal Mucosa; Nasal Swab  Result Value Ref Range Status   MRSA by PCR Next Gen NOT DETECTED NOT DETECTED Final    Comment: (NOTE) The GeneXpert MRSA Assay (FDA approved for NASAL specimens only), is one component of a  comprehensive MRSA colonization surveillance program. It is not intended to diagnose MRSA infection nor to guide or monitor treatment for MRSA infections. Test performance is not FDA approved in patients less than 34 years old. Performed at [facility], [place] [address], [place], [place] [address]     RADIOLOGY STUDIES/RESULTS: DG CHEST PORT 1 VIEW  Result Date: 10/05/2022 CLINICAL DATA:  Cough, dyspnea, PE EXAM: PORTABLE CHEST 1 VIEW COMPARISON:  None Available. FINDINGS: Slightly low lung volumes. Stable cardiomediastinal silhouette with normal heart size. No pneumothorax. No pleural effusion. No pulmonary edema. Mild hazy right lung base opacity. IMPRESSION: Slightly low lung volumes. Mild hazy right lung base opacity, favor atelectasis, cannot exclude pneumonia or evolving pulmonary infarct given history of PE. Electronically Signed   By: Ilona Sorrel M.D.   On: 10/05/2022 08:30     LOS: 1 day   Oren Binet, MD  [facility]    To contact the attending provider between 7A-7P or the covering provider during after hours 7P-7A, please log into the web site [email or web address] and access using universal [place] password for that web site. If you do not have the password, please call the hospital operator.  10/05/2022, 9:59 AM

## 2022-10-05 NOTE — Progress Notes (Signed)
Lower extremity venous has been completed.   Preliminary results in CV Proc.   Jinny Blossom Jonathon Castelo 10/05/2022 11:35 AM

## 2022-10-05 NOTE — Progress Notes (Addendum)
Rounding Note    Patient Name: Marissa Parker Date of Encounter: 10/05/2022  [place] Cardiologist: None   Subjective   Patient sitting up in bed this morning, in no acute distress. She continuous with Afib/RVR but denies awareness of palpitations and has no chest pain or dyspnea. Patient reports a poor night of sleep. Apparently her CPAP did not function properly and her oxygen dropped into the 80s, prompting nursing to check on her. Patient was unable to go back to sleep.   Inpatient Medications    Scheduled Meds:  apixaban  10 mg Oral BID   Followed by   Derrill Memo ON 10/11/2022] apixaban  5 mg Oral BID   hydrocortisone sod succinate (SOLU-CORTEF) inj  100 mg Intravenous Q8H   Iodine Strong (Lugols)  0.2 mL Oral Q6H   propranolol  10 mg Oral TID   propylthiouracil  50 mg Oral Q8H   Continuous Infusions:  PRN Meds: acetaminophen, metoprolol tartrate   Vital Signs    Vitals:   10/05/22 0307 10/05/22 0400 10/05/22 0750 10/05/22 0800  BP: 123/76 (!) 131/110 102/78 123/71  Pulse: (!) 135 (!) 105 (!) 139   Resp: 20 (!) 27 (!) 27   Temp: 98.6 F (37 C)  98.7 F (37.1 C)   TempSrc: Oral  Oral   SpO2: 94% 93% 92%   Weight:      Height:        Intake/Output Summary (Last 24 hours) at 10/05/2022 0936 Last data filed at 10/05/2022 0307 Gross per 24 hour  Intake 338.01 ml  Output --  Net 338.01 ml      10/04/2022   10:00 AM 02/24/2016    1:18 PM 01/20/2016    2:45 PM  Last 3 Weights  Weight (lbs) 169 lb 162 lb 162 lb 3.2 oz  Weight (kg) 76.658 kg 73.483 kg 73.573 kg      Telemetry    Afib with RVR, increased ventricular rate this morning - Personally Reviewed  ECG    No new tracing  Physical Exam   GEN: No acute distress.   Neck: No JVD Cardiac: RRR, no murmurs, rubs, or gallops.  Respiratory: Clear to auscultation bilaterally. GI: Soft, nontender, non-distended  MS: No edema; No deformity. Neuro:  Nonfocal  Psych: Normal affect   Labs     High Sensitivity Troponin:   Recent Labs  Lab 10/04/22 1140  TROPONINIHS 8     Chemistry Recent Labs  Lab 10/04/22 1140 10/05/22 0453  NA 140 141  K 3.9 4.2  CL 106 109  CO2 24 25  GLUCOSE 119* 123*  BUN 14 17  CREATININE 0.45 0.51  CALCIUM 9.4 9.4  MG 1.9 2.1  PROT 6.3*  --   ALBUMIN 2.5*  --   AST 43*  --   ALT 40  --   ALKPHOS 85  --   BILITOT 0.7  --   GFRNONAA >60 >60  ANIONGAP 10 7    Lipids No results for input(s): "CHOL", "TRIG", "HDL", "LABVLDL", "LDLCALC", "CHOLHDL" in the last 168 hours.  Hematology Recent Labs  Lab 10/04/22 1140 10/05/22 0453  WBC 5.4 6.3  RBC 3.85* 3.83*  HGB 12.6 12.7  HCT 37.8 37.4  MCV 98.2 97.7  MCH 32.7 33.2  MCHC 33.3 34.0  RDW 12.8 12.9  PLT 163 166   Thyroid  Recent Labs  Lab 10/04/22 1140  TSH <0.010*  FREET4 5.23*    BNP Recent Labs  Lab 10/04/22 1140  BNP 853.8*    DDimer  Recent Labs  Lab 10/04/22 1140  DDIMER 2.81*     Radiology    DG CHEST PORT 1 VIEW  Result Date: 10/05/2022 CLINICAL DATA:  Cough, dyspnea, PE EXAM: PORTABLE CHEST 1 VIEW COMPARISON:  None Available. FINDINGS: Slightly low lung volumes. Stable cardiomediastinal silhouette with normal heart size. No pneumothorax. No pleural effusion. No pulmonary edema. Mild hazy right lung base opacity. IMPRESSION: Slightly low lung volumes. Mild hazy right lung base opacity, favor atelectasis, cannot exclude pneumonia or evolving pulmonary infarct given history of PE. Electronically Signed   By: Ilona Sorrel M.D.   On: 10/05/2022 08:30    Cardiac Studies   N/A. TTE pending  Patient Profile     Marissa Parker is a 76 y.o. female with a hx of OSA, basal cell carcinoma s/p Mohs procedure, shingles with anisocoria who is being seen 10/04/2022 for the evaluation of new onset atrial fibrillation in the setting of hyperthyroidism and PE at the request of Dr. Elsworth Soho.   Assessment & Plan    New onset atrial fibrillation   Patient with newly discovered  atrial fibrillation at OSH following ED visit for dyspnea and palpitations. With concurrent PE as discovered on CTA chest and hyperthyroidism, patient transferred to [facility] for additional management. Total duration of afib is unclear. Patient currently in afib with rates 100s-140s, typically under 120. BNP elevated to 853.8 but no hypervolemia appreciated on physical exam.   Although it is possible that patient hid a clot in RAA appendage causing her PE, overall risk of PE 2/2 to afib is relatively low. Will be important to work up alternative etiologies of PE.  Would consider increasing Propranolol dosing to '20mg'$  TID if tolerated by BP.  PRN Lopressor IV pushes If significant and symptomatic RVR were to develop, would recommend avoiding Amiodarone due to thyroid/iodine interaction.  Continue thyroid management per primary team. True rhythm control will be challenging in the setting of hyperthyroidism/thyroid storm. If afib persists after optimized thyroid management, should consider DCCV after a minimum of 3 weeks of anticoagulation. On OSH obtained earlier this year, patient had normal sized atria, likely has a good chance of maintaining NSR assuming adequate thyroid management. Continue Eliquis '10mg'$  BID x7 days then '5mg'$  BID.   Add diltiazem infusion this morning. Patient with increased ventricular rates this morning. Received steroids overnight, likely a contributing factor. TTE pending today     Hyperthyroidism   Patient with undetectable TSH, free T4 of 4.10 at OSH. T3 6.2, T4 free 5.23. Patient appears to be strongly against thyroidectomy at this time.    Continue PTU per primary team. Continue Propranolol '10mg'$  TID.     Pulmonary Embolism   CTA chest at OSH notable for multiple emboli in segmental arteries of right lower lobe. Patient without evidence of respiratory distress or hemodynamic compromise. TTE and lower extremity dopplers pending. Eliquis '10mg'$  BID x7 days then '5mg'$  BID.    OSA    Patient reports nightly use of CPAP. Continue while inpatient.     For questions or updates, please contact [place] Please consult [email or web address] for contact info under        Signed, Lily Kocher, PA-C  10/05/2022, 9:36 AM    Patient seen and examined with Lily Kocher PA-C.  Agree as above, with the following exceptions and changes as noted below. Feeling weak at this time. On diltiazem IV currently, rates are somewhat better controlled. Gen: NAD, CV: no murmur, tachy  and irregular, Lungs: clear, Abd: soft, Extrem: Warm, well perfused, no edema, Neuro/Psych: alert and oriented x 3, normal mood and affect. All available labs, radiology testing, previous records reviewed. Recommend continuing IV diltiazem and propranolol. Will see if we can achieve adequate rate control with dilt and will eventually transition to oral.  Elouise Munroe, MD 10/05/22 5:05 PM

## 2022-10-05 NOTE — Progress Notes (Signed)
Pt complaining about home cpap machine/mask. RT attempted to trouble shoot with no luck. Pt placed on hospital cpap machine per request. Pt still stating something is still not right with the mask, pt stated she can't tolerate hospital mask, she would rather stay on hers. RN aware.

## 2022-10-05 NOTE — Progress Notes (Signed)
O2 saturations 86% while sleeping on home CPAP. RRT at bedside. 2l/[place] bleed in applied.

## 2022-10-05 NOTE — Telephone Encounter (Signed)
Pharmacy Patient Advocate Encounter  Insurance verification completed.    The patient is insured through [employer] Part [employer]   The patient is currently admitted and ran test claims for the following: Eliquis.  Copays and coinsurance results were relayed to Inpatient clinical team.

## 2022-10-05 NOTE — TOC Benefit Eligibility Note (Signed)
Patient Teacher, English as a foreign language completed.    The patient is currently admitted and upon discharge could be taking Eliquis Starter Pack.  The current 30 day co-pay is $25.00.   The patient is insured through [place], [place] Patient Advocate Specialist [place] Patient Advocate Team Direct Number: [phone]  Fax: [phone]

## 2022-10-05 NOTE — Progress Notes (Signed)
Heart rate is too high for accurate echo at this time. 

## 2022-10-05 NOTE — Progress Notes (Signed)
  Transition of Care [facility]) Screening Note   Patient Details  Name: Marissa Parker Date of Birth: 09-08-1946   Transition of Care [facility]) CM/SW Contact:    Cyndi Bender, RN Phone Number: 10/05/2022, 8:26 AM    Transition of Care Department [facility]) has reviewed patient and no TOC needs have been identified at this time. We will continue to monitor patient advancement through interdisciplinary progression rounds. If new patient transition needs arise, please place a TOC consult.

## 2022-10-06 ENCOUNTER — Inpatient Hospital Stay (HOSPITAL_COMMUNITY): Payer: Medicare Other

## 2022-10-06 DIAGNOSIS — I4891 Unspecified atrial fibrillation: Secondary | ICD-10-CM | POA: Diagnosis not present

## 2022-10-06 DIAGNOSIS — I4819 Other persistent atrial fibrillation: Secondary | ICD-10-CM | POA: Diagnosis not present

## 2022-10-06 DIAGNOSIS — G4733 Obstructive sleep apnea (adult) (pediatric): Secondary | ICD-10-CM | POA: Diagnosis not present

## 2022-10-06 DIAGNOSIS — E0591 Thyrotoxicosis, unspecified with thyrotoxic crisis or storm: Secondary | ICD-10-CM | POA: Diagnosis not present

## 2022-10-06 LAB — CBC
HCT: 35.1 % — ABNORMAL LOW (ref 36.0–46.0)
Hemoglobin: 11.7 g/dL — ABNORMAL LOW (ref 12.0–15.0)
MCH: 33 pg (ref 26.0–34.0)
MCHC: 33.3 g/dL (ref 30.0–36.0)
MCV: 98.9 fL (ref 80.0–100.0)
Platelets: 157 10*3/uL (ref 150–400)
RBC: 3.55 MIL/uL — ABNORMAL LOW (ref 3.87–5.11)
RDW: 13 % (ref 11.5–15.5)
WBC: 5.9 10*3/uL (ref 4.0–10.5)
nRBC: 0 % (ref 0.0–0.2)

## 2022-10-06 LAB — BASIC METABOLIC PANEL
Anion gap: 6 (ref 5–15)
BUN: 24 mg/dL — ABNORMAL HIGH (ref 8–23)
CO2: 25 mmol/L (ref 22–32)
Calcium: 9.2 mg/dL (ref 8.9–10.3)
Chloride: 109 mmol/L (ref 98–111)
Creatinine, Ser: 0.61 mg/dL (ref 0.44–1.00)
GFR, Estimated: >60 mL/min (ref >60)
Glucose, Bld: 133 mg/dL — ABNORMAL HIGH (ref 70–99)
Potassium: 4.5 mmol/L (ref 3.5–5.1)
Sodium: 140 mmol/L (ref 135–145)

## 2022-10-06 LAB — GLUCOSE, CAPILLARY
Glucose-Capillary: 122 mg/dL — ABNORMAL HIGH (ref 70–99)
Glucose-Capillary: 138 mg/dL — ABNORMAL HIGH (ref 70–99)
Glucose-Capillary: 125 mg/dL — ABNORMAL HIGH (ref 70–99)
Glucose-Capillary: 109 mg/dL — ABNORMAL HIGH (ref 70–99)

## 2022-10-06 MED ORDER — SODIUM CHLORIDE 0.9 % IV BOLUS
250.0000 mL | Freq: Once | INTRAVENOUS | Status: AC
  Administered 2022-10-06: 250 mL via INTRAVENOUS

## 2022-10-06 MED ORDER — DILTIAZEM HCL 60 MG PO TABS
30.0000 mg | ORAL_TABLET | Freq: Four times a day (QID) | ORAL | Status: DC
  Administered 2022-10-06 – 2022-10-07 (×4): 30 mg via ORAL
  Filled 2022-10-06 (×3): qty 1

## 2022-10-06 MED ORDER — PERFLUTREN LIPID MICROSPHERE
1.0000 mL | INTRAVENOUS | Status: AC | PRN
  Administered 2022-10-06: 2 mL via INTRAVENOUS

## 2022-10-06 NOTE — Progress Notes (Signed)
  Echocardiogram 2D Echocardiogram has been performed.  Marissa Parker 10/06/2022, 9:37 AM

## 2022-10-06 NOTE — Progress Notes (Addendum)
PROGRESS NOTE        PATIENT DETAILS Name: Marissa Parker Age: 76 y.o. Sex: female Date of Birth: 1946-09-28 Admit Date: 10/04/2022 Admitting Physician Candee Furbish, MD GDJ:MEQASTM, No Pcp Per  Brief Summary: Patient is a 76 y.o.  female with known history of hyperthyroidism (diagnosed June 2023-wanted to try natural remedies-apparently did not tolerate PTU/propanolol subsequently)-admitted to the hospital with thyrotoxicosis and A-fib RVR, pulmonary embolism.    Significant events: 11/7>> presented to outside hospital ED with palpitations/SOB-found to have A-fib RVR, PE-concern for thyroid storm.  Given steroids, PTU, low-dose iodine. 11/8>> [facility] for thyroid storm.  Admit to ICU briefly by PCCM. 11/9>> transfer to [facility].  Significant studies: 11/8>> TSH:<0.010 11/8>> FT4: 5.23 11/9>> bilateral lower extremity Doppler: No DVT.  Significant microbiology data: None  Procedures: None  Consults: Cardiology PCCM  Subjective: Less anxious Overall much better  Objective: Vitals: Blood pressure (!) 117/99, pulse (!) 125, temperature 98 F (36.7 C), temperature source Oral, resp. rate 20, height '5\' 5"'$  (1.651 m), weight 76.7 kg, SpO2 93 %.   Exam: Gen Exam:Alert awake-not in any distress HEENT:atraumatic, normocephalic Chest: B/L clear to auscultation anteriorly CVS:S1S2 irregular Abdomen:soft non tender, non distended Extremities:no edema Neurology: Non focal Skin: no rash  Pertinent Labs/Radiology:    Latest Ref Rng & Units 10/06/2022    2:23 AM 10/05/2022    4:53 AM 10/04/2022   11:40 AM  CBC  WBC 4.0 - 10.5 K/uL 5.9  6.3  5.4   Hemoglobin 12.0 - 15.0 g/dL 11.7  12.7  12.6   Hematocrit 36.0 - 46.0 % 35.1  37.4  37.8   Platelets 150 - 400 K/uL 157  166  163     Lab Results  Component Value Date   NA 140 10/06/2022   K 4.5 10/06/2022   CL 109 10/06/2022   CO2 25 10/06/2022       Assessment/Plan: Thyrotoxicosis Doubt thyroid storm Stop hydrocortisone and lugols iodine today  Continue PTU with plans to switch to methimazole prior to discharge.   We will need outpatient endocrine follow-up (per patient-family will arrange)  New onset A-fib RVR Rate better controlled-no longer on Cardizem gtt.   Continue propanolol/short acting Cardizem  Remains on Eliquis  Cardiology following.  Await echo  Pulmonary embolism Appears unprovoked Lower extremity Dopplers negative On Eliquis Likely needs indefinite anticoagulation  OSA CPAP qhs  Anxiety Suspect this may be related to thyrotoxicosis Apparently got Valium at [facility] does not want to try benzos again. Anxiety much better today as heart rate/thyrotoxicosis symptoms get better controlled.  BMI: Estimated body mass index is 28.12 kg/m as calculated from the following:   Height as of this encounter: '5\' 5"'$  (1.651 m).   Weight as of this encounter: 76.7 kg.   Code status:   Code Status: Not on file   DVT Prophylaxis: apixaban (ELIQUIS) tablet 10 mg  apixaban (ELIQUIS) tablet 5 mg     Family Communication: None at bedside   Disposition Plan: Status is: Inpatient Remains inpatient appropriate because: A-fib RVR-thyrotoxicosis-not yet stable for discharge.   Planned Discharge Destination:Home   Diet: Diet Order             Diet regular Room service appropriate? Yes; Fluid consistency: Thin  Diet effective now  Antimicrobial agents: Anti-infectives (From admission, onward)    None        MEDICATIONS: Scheduled Meds:  apixaban  10 mg Oral BID   Followed by   Derrill Memo ON 10/11/2022] apixaban  5 mg Oral BID   diltiazem  30 mg Oral Q6H   hydrocortisone sod succinate (SOLU-CORTEF) inj  50 mg Intravenous Q8H   Iodine Strong (Lugols)  0.2 mL Oral Q6H   propranolol  10 mg Oral TID   propylthiouracil  50 mg Oral Q8H   Continuous Infusions:   PRN  Meds:.acetaminophen, metoprolol tartrate, perflutren lipid microspheres (DEFINITY) IV suspension   I have personally reviewed following labs and imaging studies  LABORATORY DATA: CBC: Recent Labs  Lab 10/04/22 1140 10/05/22 0453 10/06/22 0223  WBC 5.4 6.3 5.9  HGB 12.6 12.7 11.7*  HCT 37.8 37.4 35.1*  MCV 98.2 97.7 98.9  PLT 163 166 157     Basic Metabolic Panel: Recent Labs  Lab 10/04/22 1140 10/05/22 0453 10/06/22 0223  NA 140 141 140  K 3.9 4.2 4.5  CL 106 109 109  CO2 '24 25 25  '$ GLUCOSE 119* 123* 133*  BUN 14 17 24*  CREATININE 0.45 0.51 0.61  CALCIUM 9.4 9.4 9.2  MG 1.9 2.1  --   PHOS 3.7 4.5  --      GFR: Estimated Creatinine Clearance: 61.3 mL/min (by C-G formula based on SCr of 0.61 mg/dL).  Liver Function Tests: Recent Labs  Lab 10/04/22 1140  AST 43*  ALT 40  ALKPHOS 85  BILITOT 0.7  PROT 6.3*  ALBUMIN 2.5*    No results for input(s): "LIPASE", "AMYLASE" in the last 168 hours. No results for input(s): "AMMONIA" in the last 168 hours.  Coagulation Profile: No results for input(s): "INR", "PROTIME" in the last 168 hours.  Cardiac Enzymes: No results for input(s): "CKTOTAL", "CKMB", "CKMBINDEX", "TROPONINI" in the last 168 hours.  BNP (last 3 results) No results for input(s): "PROBNP" in the last 8760 hours.  Lipid Profile: No results for input(s): "CHOL", "HDL", "LDLCALC", "TRIG", "CHOLHDL", "LDLDIRECT" in the last 72 hours.  Thyroid Function Tests: Recent Labs    10/04/22 1140  TSH <0.010*  FREET4 5.23*  T3FREE 6.2*     Anemia Panel: No results for input(s): "VITAMINB12", "FOLATE", "FERRITIN", "TIBC", "IRON", "RETICCTPCT" in the last 72 hours.  Urine analysis: No results found for: "COLORURINE", "APPEARANCEUR", "LABSPEC", "PHURINE", "GLUCOSEU", "HGBUR", "BILIRUBINUR", "KETONESUR", "PROTEINUR", "UROBILINOGEN", "NITRITE", "LEUKOCYTESUR"  Sepsis Labs: Lactic Acid, Venous No results found for:  "LATICACIDVEN"  MICROBIOLOGY: Recent Results (from the past 240 hour(s))  MRSA Next Gen by PCR, Nasal     Status: None   Collection Time: 10/04/22 11:05 AM   Specimen: Nasal Mucosa; Nasal Swab  Result Value Ref Range Status   MRSA by PCR Next Gen NOT DETECTED NOT DETECTED Final    Comment: (NOTE) The GeneXpert MRSA Assay (FDA approved for NASAL specimens only), is one component of a comprehensive MRSA colonization surveillance program. It is not intended to diagnose MRSA infection nor to guide or monitor treatment for MRSA infections. Test performance is not FDA approved in patients less than 7 years old. Performed at [facility], [place] [address], [place], [place] [address]     RADIOLOGY STUDIES/RESULTS: VAS [place] LOWER EXTREMITY VENOUS (DVT)  Result Date: 10/05/2022  Lower Venous DVT Study Patient Name:  Marissa Parker  Date of Exam:   10/05/2022 Medical Rec #: [id number]   Accession #:    [id number]  Date of Birth: 15-Oct-1946    Patient Gender: F Patient Age:   29 years Exam Location:  [facility] Procedure:      VAS [place] LOWER EXTREMITY VENOUS (DVT) Referring Phys: RAKESH ALVA --------------------------------------------------------------------------------  Indications: Pulmonary embolism.  Comparison Study: no prior Performing Technologist: Archie Patten RVS  Examination Guidelines: A complete evaluation includes B-mode imaging, spectral Doppler, color Doppler, and power Doppler as needed of all accessible portions of each vessel. Bilateral testing is considered an integral part of a complete examination. Limited examinations for reoccurring indications may be performed as noted. The reflux portion of the exam is performed with the patient in reverse Trendelenburg.  +---------+---------------+---------+-----------+----------+--------------+ RIGHT    CompressibilityPhasicitySpontaneityPropertiesThrombus Aging  +---------+---------------+---------+-----------+----------+--------------+ CFV      Full           Yes      Yes                                 +---------+---------------+---------+-----------+----------+--------------+ SFJ      Full                                                        +---------+---------------+---------+-----------+----------+--------------+ FV Prox  Full                                                        +---------+---------------+---------+-----------+----------+--------------+ FV Mid   Full                                                        +---------+---------------+---------+-----------+----------+--------------+ FV DistalFull                                                        +---------+---------------+---------+-----------+----------+--------------+ PFV      Full                                                        +---------+---------------+---------+-----------+----------+--------------+ POP      Full           Yes      Yes                                 +---------+---------------+---------+-----------+----------+--------------+ PTV      Full                                                        +---------+---------------+---------+-----------+----------+--------------+  PERO     Full                                                        +---------+---------------+---------+-----------+----------+--------------+   +--------+---------------+---------+-----------+----------+--------------------+ LEFT    CompressibilityPhasicitySpontaneityPropertiesThrombus Aging       +--------+---------------+---------+-----------+----------+--------------------+ CFV     Full           Yes      Yes                                       +--------+---------------+---------+-----------+----------+--------------------+ SFJ     Full                                                               +--------+---------------+---------+-----------+----------+--------------------+ FV Prox Full                                                              +--------+---------------+---------+-----------+----------+--------------------+ FV Mid  Full                                                              +--------+---------------+---------+-----------+----------+--------------------+ FV                     Yes      Yes                  patent by color      Distal                                               doppler              +--------+---------------+---------+-----------+----------+--------------------+ PFV     Full                                                              +--------+---------------+---------+-----------+----------+--------------------+ POP     Full           Yes      Yes                                       +--------+---------------+---------+-----------+----------+--------------------+ PTV     Full                                                              +--------+---------------+---------+-----------+----------+--------------------+  PERO    Full                                                              +--------+---------------+---------+-----------+----------+--------------------+     Summary: BILATERAL: - No evidence of deep vein thrombosis seen in the lower extremities, bilaterally. -No evidence of popliteal cyst, bilaterally.   *See table(s) above for measurements and observations. Electronically signed by Orlie Pollen on 10/05/2022 at 2:41:43 PM.    Final    DG CHEST PORT 1 VIEW  Result Date: 10/05/2022 CLINICAL DATA:  Cough, dyspnea, PE EXAM: PORTABLE CHEST 1 VIEW COMPARISON:  None Available. FINDINGS: Slightly low lung volumes. Stable cardiomediastinal silhouette with normal heart size. No pneumothorax. No pleural effusion. No pulmonary edema. Mild hazy right lung base opacity. IMPRESSION: Slightly low lung  volumes. Mild hazy right lung base opacity, favor atelectasis, cannot exclude pneumonia or evolving pulmonary infarct given history of PE. Electronically Signed   By: Ilona Sorrel M.D.   On: 10/05/2022 08:30     LOS: 2 days   Oren Binet, MD  [facility]    To contact the attending provider between 7A-7P or the covering provider during after hours 7P-7A, please log into the web site [email or web address] and access using universal [place] password for that web site. If you do not have the password, please call the hospital operator.  10/06/2022, 11:07 AM

## 2022-10-06 NOTE — Progress Notes (Signed)
Rounding Note    Patient Name: Marissa Parker Date of Encounter: 10/06/2022  [place] Cardiologist: None   Subjective   Denies any cardiac awareness of atrial fibrillation.   Inpatient Medications    Scheduled Meds:  apixaban  10 mg Oral BID   Followed by   Derrill Memo ON 10/11/2022] apixaban  5 mg Oral BID   diltiazem  30 mg Oral Q6H   hydrocortisone sod succinate (SOLU-CORTEF) inj  50 mg Intravenous Q8H   Iodine Strong (Lugols)  0.2 mL Oral Q6H   propranolol  10 mg Oral TID   propylthiouracil  50 mg Oral Q8H   Continuous Infusions:  PRN Meds: acetaminophen, metoprolol tartrate, perflutren lipid microspheres (DEFINITY) IV suspension   Vital Signs    Vitals:   10/06/22 0400 10/06/22 0500 10/06/22 0519 10/06/22 0700  BP: (!) 92/55 96/60 101/70 106/70  Pulse: (!) 102 100 (!) 102 97  Resp: '18 19 20 20  '$ Temp:  97.6 F (36.4 C)  98 F (36.7 C)  TempSrc:  Oral  Oral  SpO2: 90% 93% 94% 93%  Weight:      Height:        Intake/Output Summary (Last 24 hours) at 10/06/2022 0959 Last data filed at 10/06/2022 0300 Gross per 24 hour  Intake 354.51 ml  Output --  Net 354.51 ml      10/04/2022   10:00 AM 02/24/2016    1:18 PM 01/20/2016    2:45 PM  Last 3 Weights  Weight (lbs) 169 lb 162 lb 162 lb 3.2 oz  Weight (kg) 76.658 kg 73.483 kg 73.573 kg      Telemetry    Atrial fibrillation with HR high 90s to 110s at rest, with any activity HR increased to 130s - Personally Reviewed  ECG    Atrial fibrillation with RVR - Personally Reviewed  Physical Exam   GEN: No acute distress.   Neck: No JVD Cardiac: irregularly irregular, no murmurs, rubs, or gallops.  Respiratory: Clear to auscultation bilaterally. GI: Soft, nontender, non-distended  MS: No edema; No deformity. Neuro:  Nonfocal  Psych: Normal affect   Labs    High Sensitivity Troponin:   Recent Labs  Lab 10/04/22 1140  TROPONINIHS 8     Chemistry Recent Labs  Lab 10/04/22 1140  10/05/22 0453 10/06/22 0223  NA 140 141 140  K 3.9 4.2 4.5  CL 106 109 109  CO2 '24 25 25  '$ GLUCOSE 119* 123* 133*  BUN 14 17 24*  CREATININE 0.45 0.51 0.61  CALCIUM 9.4 9.4 9.2  MG 1.9 2.1  --   PROT 6.3*  --   --   ALBUMIN 2.5*  --   --   AST 43*  --   --   ALT 40  --   --   ALKPHOS 85  --   --   BILITOT 0.7  --   --   GFRNONAA >60 >60 >60  ANIONGAP '10 7 6    '$ Lipids No results for input(s): "CHOL", "TRIG", "HDL", "LABVLDL", "LDLCALC", "CHOLHDL" in the last 168 hours.  Hematology Recent Labs  Lab 10/04/22 1140 10/05/22 0453 10/06/22 0223  WBC 5.4 6.3 5.9  RBC 3.85* 3.83* 3.55*  HGB 12.6 12.7 11.7*  HCT 37.8 37.4 35.1*  MCV 98.2 97.7 98.9  MCH 32.7 33.2 33.0  MCHC 33.3 34.0 33.3  RDW 12.8 12.9 13.0  PLT 163 166 157   Thyroid  Recent Labs  Lab 10/04/22 1140  TSH <  0.010*  FREET4 5.23*    BNP Recent Labs  Lab 10/04/22 1140  BNP 853.8*    DDimer  Recent Labs  Lab 10/04/22 1140  DDIMER 2.81*     Radiology    VAS [place] LOWER EXTREMITY VENOUS (DVT)  Result Date: 10/05/2022  Lower Venous DVT Study Patient Name:  Ary LECHELLE WRIGLEY  Date of Exam:   10/05/2022 Medical Rec #: [id number]   Accession #:    [id number] Date of Birth: 1945-12-12    Patient Gender: F Patient Age:   34 years Exam Location:  [facility] Procedure:      VAS [place] LOWER EXTREMITY VENOUS (DVT) Referring Phys: RAKESH ALVA --------------------------------------------------------------------------------  Indications: Pulmonary embolism.  Comparison Study: no prior Performing Technologist: Archie Patten RVS  Examination Guidelines: A complete evaluation includes B-mode imaging, spectral Doppler, color Doppler, and power Doppler as needed of all accessible portions of each vessel. Bilateral testing is considered an integral part of a complete examination. Limited examinations for reoccurring indications may be performed as noted. The reflux portion of the exam is performed with the patient in reverse  Trendelenburg.  +---------+---------------+---------+-----------+----------+--------------+ RIGHT    CompressibilityPhasicitySpontaneityPropertiesThrombus Aging +---------+---------------+---------+-----------+----------+--------------+ CFV      Full           Yes      Yes                                 +---------+---------------+---------+-----------+----------+--------------+ SFJ      Full                                                        +---------+---------------+---------+-----------+----------+--------------+ FV Prox  Full                                                        +---------+---------------+---------+-----------+----------+--------------+ FV Mid   Full                                                        +---------+---------------+---------+-----------+----------+--------------+ FV DistalFull                                                        +---------+---------------+---------+-----------+----------+--------------+ PFV      Full                                                        +---------+---------------+---------+-----------+----------+--------------+ POP      Full           Yes      Yes                                 +---------+---------------+---------+-----------+----------+--------------+  PTV      Full                                                        +---------+---------------+---------+-----------+----------+--------------+ PERO     Full                                                        +---------+---------------+---------+-----------+----------+--------------+   +--------+---------------+---------+-----------+----------+--------------------+ LEFT    CompressibilityPhasicitySpontaneityPropertiesThrombus Aging       +--------+---------------+---------+-----------+----------+--------------------+ CFV     Full           Yes      Yes                                        +--------+---------------+---------+-----------+----------+--------------------+ SFJ     Full                                                              +--------+---------------+---------+-----------+----------+--------------------+ FV Prox Full                                                              +--------+---------------+---------+-----------+----------+--------------------+ FV Mid  Full                                                              +--------+---------------+---------+-----------+----------+--------------------+ FV                     Yes      Yes                  patent by color      Distal                                               doppler              +--------+---------------+---------+-----------+----------+--------------------+ PFV     Full                                                              +--------+---------------+---------+-----------+----------+--------------------+ POP     Full  Yes      Yes                                       +--------+---------------+---------+-----------+----------+--------------------+ PTV     Full                                                              +--------+---------------+---------+-----------+----------+--------------------+ PERO    Full                                                              +--------+---------------+---------+-----------+----------+--------------------+     Summary: BILATERAL: - No evidence of deep vein thrombosis seen in the lower extremities, bilaterally. -No evidence of popliteal cyst, bilaterally.   *See table(s) above for measurements and observations. Electronically signed by Orlie Pollen on 10/05/2022 at 2:41:43 PM.    Final    DG CHEST PORT 1 VIEW  Result Date: 10/05/2022 CLINICAL DATA:  Cough, dyspnea, PE EXAM: PORTABLE CHEST 1 VIEW COMPARISON:  None Available. FINDINGS: Slightly low lung volumes. Stable cardiomediastinal  silhouette with normal heart size. No pneumothorax. No pleural effusion. No pulmonary edema. Mild hazy right lung base opacity. IMPRESSION: Slightly low lung volumes. Mild hazy right lung base opacity, favor atelectasis, cannot exclude pneumonia or evolving pulmonary infarct given history of PE. Electronically Signed   By: Ilona Sorrel M.D.   On: 10/05/2022 08:30    Cardiac Studies   Pending echo 10/06/2022  Patient Profile     76 y.o. female with PMH of OSA, basal cell carcinoma s/p Mohs procedure, shingles with anisocoria who presented with new onset atrial fibrillation in the setting of hyperthyroidism  Assessment & Plan    New onset of atrial fibrillation with RVR  - pending echocardiogram  - HR high 90s to 110s, reasonably controlled in the setting of hyperthyroidism and PE  - current on propranolol '10mg'$  TID and short acting diltiazem '30mg'$  QID. BP does not allow up to increase rate control further, however I think the current HR is reasonably controlled. Continue Eliquis.   Hyperthyroidism: per primary team.  PE: CTA at OSH showed multiple emboli in the segmental arteries of RLL. Eliquis '10mg'$  BID for 7 days, then '5mg'$  BID. Venous doppler engative on 10/05/2022      For questions or updates, please contact [place] Please consult [email or web address] for contact info under        Signed, Almyra Deforest, [place]  10/06/2022, 9:59 AM

## 2022-10-06 NOTE — Discharge Instructions (Signed)
Information on my medicine - ELIQUIS (apixaban)  This medication education was reviewed with me or my healthcare representative as part of my discharge preparation.    Why was Eliquis prescribed for you? Eliquis was prescribed to treat blood clots that may have been found in the veins of your legs (deep vein thrombosis) or in your lungs (pulmonary embolism) and to reduce the risk of them occurring again.  What do You need to know about Eliquis ? The starting dose is 10 mg (two 5 mg tablets) taken TWICE daily for the FIRST SEVEN (7) DAYS, then on 10/11/22 in the evening, the dose is reduced to ONE 5 mg tablet taken TWICE daily.  Eliquis may be taken with or without food.   Try to take the dose about the same time in the morning and in the evening. If you have difficulty swallowing the tablet whole please discuss with your pharmacist how to take the medication safely.  Take Eliquis exactly as prescribed and DO NOT stop taking Eliquis without talking to the doctor who prescribed the medication.  Stopping may increase your risk of developing a new blood clot.  Refill your prescription before you run out.  After discharge, you should have regular check-up appointments with your healthcare provider that is prescribing your Eliquis.    What do you do if you miss a dose? If a dose of ELIQUIS is not taken at the scheduled time, take it as soon as possible on the same day and twice-daily administration should be resumed. The dose should not be doubled to make up for a missed dose.  Important Safety Information A possible side effect of Eliquis is bleeding. You should call your healthcare provider right away if you experience any of the following: Bleeding from an injury or your nose that does not stop. Unusual colored urine (red or dark brown) or unusual colored stools (red or black). Unusual bruising for unknown reasons. A serious fall or if you hit your head (even if there is no  bleeding).  Some medicines may interact with Eliquis and might increase your risk of bleeding or clotting while on Eliquis. To help avoid this, consult your healthcare provider or pharmacist prior to using any new prescription or non-prescription medications, including herbals, vitamins, non-steroidal anti-inflammatory drugs (NSAIDs) and supplements.  This website has more information on Eliquis (apixaban): http://www.eliquis.com/eliquis/home

## 2022-10-07 DIAGNOSIS — E0590 Thyrotoxicosis, unspecified without thyrotoxic crisis or storm: Secondary | ICD-10-CM

## 2022-10-07 LAB — ECHOCARDIOGRAM COMPLETE
Area-P 1/2: 4.42 cm2
Height: 65 in
S' Lateral: 4.2 cm
Weight: 2704 oz

## 2022-10-07 LAB — GLUCOSE, CAPILLARY
Glucose-Capillary: 107 mg/dL — ABNORMAL HIGH (ref 70–99)
Glucose-Capillary: 91 mg/dL (ref 70–99)
Glucose-Capillary: 106 mg/dL — ABNORMAL HIGH (ref 70–99)
Glucose-Capillary: 88 mg/dL (ref 70–99)

## 2022-10-07 LAB — CBC
HCT: 36.1 % (ref 36.0–46.0)
Hemoglobin: 11.7 g/dL — ABNORMAL LOW (ref 12.0–15.0)
MCH: 32.6 pg (ref 26.0–34.0)
MCHC: 32.4 g/dL (ref 30.0–36.0)
MCV: 100.6 fL — ABNORMAL HIGH (ref 80.0–100.0)
Platelets: 163 10*3/uL (ref 150–400)
RBC: 3.59 MIL/uL — ABNORMAL LOW (ref 3.87–5.11)
RDW: 13 % (ref 11.5–15.5)
WBC: 7.8 10*3/uL (ref 4.0–10.5)
nRBC: 0 % (ref 0.0–0.2)

## 2022-10-07 MED ORDER — METHIMAZOLE 5 MG PO TABS: 5.0000 mg | ORAL_TABLET | Freq: Every day | ORAL | 1 refills | Status: DC

## 2022-10-07 MED ORDER — APIXABAN 5 MG PO TABS: ORAL_TABLET | ORAL | 1 refills | Status: DC

## 2022-10-07 MED ORDER — METHIMAZOLE 5 MG PO TABS: 5.0000 mg | ORAL_TABLET | Freq: Every day | ORAL | Status: DC

## 2022-10-07 MED ORDER — METHIMAZOLE 5 MG PO TABS
20.0000 mg | ORAL_TABLET | Freq: Every day | ORAL | Status: DC
  Administered 2022-10-07 – 2022-10-08 (×2): 20 mg via ORAL
  Filled 2022-10-07 (×2): qty 4

## 2022-10-07 MED ORDER — PROPRANOLOL HCL 10 MG PO TABS: 10.0000 mg | ORAL_TABLET | Freq: Three times a day (TID) | ORAL | 1 refills | Status: DC

## 2022-10-07 MED ORDER — PANTOPRAZOLE SODIUM 40 MG IV SOLR: 40.0000 mg | INTRAVENOUS | Status: DC

## 2022-10-07 MED ORDER — DILTIAZEM HCL ER COATED BEADS 180 MG PO CP24: 180.0000 mg | ORAL_CAPSULE | Freq: Every day | ORAL | 1 refills | Status: DC

## 2022-10-07 MED ORDER — DILTIAZEM HCL ER COATED BEADS 180 MG PO CP24
180.0000 mg | ORAL_CAPSULE | Freq: Every day | ORAL | Status: DC
  Administered 2022-10-07 – 2022-10-08 (×2): 180 mg via ORAL
  Filled 2022-10-07 (×2): qty 1

## 2022-10-07 MED ORDER — PROPRANOLOL HCL ER 60 MG PO CP24
60.0000 mg | ORAL_CAPSULE | Freq: Every day | ORAL | Status: DC
  Administered 2022-10-07 – 2022-10-08 (×2): 60 mg via ORAL
  Filled 2022-10-07 (×2): qty 1

## 2022-10-07 NOTE — TOC Transition Note (Signed)
Transition of Care [facility]) - CM/SW Discharge Note   Patient Details  Name: Marissa Parker MRN: [id number] Date of Birth: 1946/10/30  Transition of Care [facility]) CM/SW Contact:  Carles Collet, RN Phone Number: 10/07/2022, 9:52 AM   Clinical Narrative:     Tubed 30 day Eliquis card to nurse, notified by secure chat to provide to patient w DC. No other TOC needs identified for DC        Patient Goals and CMS Choice        Discharge Placement                       Discharge Plan and Services                                     Social Determinants of Health (SDOH) Interventions     Readmission Risk Interventions     No data to display

## 2022-10-07 NOTE — Progress Notes (Addendum)
Rounding Note    Patient Name: Marissa Parker Date of Encounter: 10/07/2022  [place] Cardiologist: None   Subjective   Reports some right-sided chest discomfort today and dizziness with change in position.  She says she feels like she might have symptomatic palpitations.  She remains in atrial fibrillation with variable ventricular response and heart rates in the 110-120 range.  Blood pressure is soft in the 96E and 952W systolic.  Inpatient Medications    Scheduled Meds:  apixaban  10 mg Oral BID   Followed by   Derrill Memo ON 10/11/2022] apixaban  5 mg Oral BID   diltiazem  180 mg Oral Daily   methimazole  5 mg Oral Daily   propranolol  10 mg Oral TID   Continuous Infusions:  PRN Meds: acetaminophen, metoprolol tartrate   Vital Signs    Vitals:   10/07/22 0812 10/07/22 0925 10/07/22 1129 10/07/22 1201  BP: 93/71 105/69 94/81 117/84  Pulse: 97 (!) 103  80  Resp: 20 (!) 25  20  Temp: 97.7 F (36.5 C)   97.6 F (36.4 C)  TempSrc: Oral   Oral  SpO2: 96% 95%  95%  Weight:      Height:       No intake or output data in the 24 hours ending 10/07/22 1239     10/04/2022   10:00 AM 02/24/2016    1:18 PM 01/20/2016    2:45 PM  Last 3 Weights  Weight (lbs) 169 lb 162 lb 162 lb 3.2 oz  Weight (kg) 76.658 kg 73.483 kg 73.573 kg      Telemetry    Atrial fibrillation with HR high 90s to 110s at rest, with any activity HR increased to 130s - Personally Reviewed  ECG    Atrial fibrillation with RVR - Personally Reviewed  Physical Exam   GEN: No acute distress.   Neck: No JVD Cardiac: irregularly irregular, no murmurs, rubs, or gallops.  Respiratory: Clear to auscultation bilaterally. GI: Soft, nontender, non-distended  MS: No edema; No deformity. Neuro:  Nonfocal  Psych: Normal affect   Labs    High Sensitivity Troponin:   Recent Labs  Lab 10/04/22 1140  TROPONINIHS 8     Chemistry Recent Labs  Lab 10/04/22 1140 10/05/22 0453 10/06/22 0223   NA 140 141 140  K 3.9 4.2 4.5  CL 106 109 109  CO2 '24 25 25  '$ GLUCOSE 119* 123* 133*  BUN 14 17 24*  CREATININE 0.45 0.51 0.61  CALCIUM 9.4 9.4 9.2  MG 1.9 2.1  --   PROT 6.3*  --   --   ALBUMIN 2.5*  --   --   AST 43*  --   --   ALT 40  --   --   ALKPHOS 85  --   --   BILITOT 0.7  --   --   GFRNONAA >60 >60 >60  ANIONGAP '10 7 6    '$ Lipids No results for input(s): "CHOL", "TRIG", "HDL", "LABVLDL", "LDLCALC", "CHOLHDL" in the last 168 hours.  Hematology Recent Labs  Lab 10/05/22 0453 10/06/22 0223 10/07/22 0140  WBC 6.3 5.9 7.8  RBC 3.83* 3.55* 3.59*  HGB 12.7 11.7* 11.7*  HCT 37.4 35.1* 36.1  MCV 97.7 98.9 100.6*  MCH 33.2 33.0 32.6  MCHC 34.0 33.3 32.4  RDW 12.9 13.0 13.0  PLT 166 157 163   Thyroid  Recent Labs  Lab 10/04/22 1140  TSH <0.010*  FREET4 5.23*  BNP Recent Labs  Lab 10/04/22 1140  BNP 853.8*    DDimer  Recent Labs  Lab 10/04/22 1140  DDIMER 2.81*     Radiology    ECHOCARDIOGRAM COMPLETE  Result Date: 10/07/2022    ECHOCARDIOGRAM REPORT   Patient Name:   Marissa Parker Date of Exam: 10/06/2022 Medical Rec #:  [id number]  Height:       65.0 in Accession #:    [id number] Weight:       169.0 lb Date of Birth:  1946-02-27   BSA:          1.841 m Patient Age:    76 years   BP:           118/50 mmHg Patient Gender: F          HR:           115 bpm. Exam Location:  Inpatient Procedure: 2D Echo, Cardiac Doppler, Color Doppler and Intracardiac            Opacification Agent                                 MODIFIED REPORT:  This report was modified by Lyman Bishop MD on 10/07/2022 due to Re-evaluated contrast images - LVEF considered to be around 50% with mild global hypokinesis,                      complicated by afib with rapid rates.  Indications:     Afib/Pulmonary Embolus  History:         Patient has prior history of Echocardiogram examinations, most                  recent 04/27/2022. Arrythmias:Atrial Fibrillation; Risk                  Factors:Sleep  Apnea and Former Smoker. Hypothyroidism.  Sonographer:     Eartha Inch Referring Phys:  [id number] Estill Cotta Diagnosing Phys: Lyman Bishop MD  Sonographer Comments: Suboptimal parasternal window. Image acquisition challenging due to patient body habitus and Image acquisition challenging due to respiratory motion. IMPRESSIONS  1. NO apical thrombus with Definity contrast. Left ventricular ejection fraction, by estimation, is 45 to 50%. The left ventricle has mildly decreased function. The left ventricle demonstrates global hypokinesis. The left ventricular internal cavity size was mildly dilated. Left ventricular diastolic function could not be evaluated.  2. Right ventricular systolic function is low normal. The right ventricular size is normal.  3. Left atrial size was severely dilated.  4. Right atrial size was mildly dilated.  5. The mitral valve is grossly normal. Trivial mitral valve regurgitation.  6. The aortic valve is tricuspid. Aortic valve regurgitation is trivial. Comparison(s): No prior Echocardiogram. Conclusion(s)/Recommendation(s): Consider LAD territory ischemia vs. possible rate-related cardiomyopathy secondary to afib. FINDINGS  Left Ventricle: NO apical thrombus with Definity contrast. Left ventricular ejection fraction, by estimation, is 45 to 50%. The left ventricle has mildly decreased function. The left ventricle demonstrates global hypokinesis. Definity contrast agent was  given IV to delineate the left ventricular endocardial borders. The left ventricular internal cavity size was mildly dilated. There is no left ventricular hypertrophy. Left ventricular diastolic function could not be evaluated due to atrial fibrillation. Left ventricular diastolic function could not be evaluated. Right Ventricle: The right ventricular size is normal. No increase in right ventricular wall thickness. Right ventricular systolic  function is low normal. Left Atrium: Left atrial size was severely  dilated. Right Atrium: Right atrial size was mildly dilated. Pericardium: There is no evidence of pericardial effusion. Mitral Valve: The mitral valve is grossly normal. Trivial mitral valve regurgitation. MV peak gradient, 5.7 mmHg. The mean mitral valve gradient is 2.0 mmHg. Tricuspid Valve: The tricuspid valve is grossly normal. Tricuspid valve regurgitation is mild. Aortic Valve: The aortic valve is tricuspid. Aortic valve regurgitation is trivial. Pulmonic Valve: The pulmonic valve was normal in structure. Pulmonic valve regurgitation is not visualized. Aorta: The aortic root and ascending aorta are structurally normal, with no evidence of dilitation. IAS/Shunts: No atrial level shunt detected by color flow Doppler.  LEFT VENTRICLE PLAX 2D LVIDd:         5.20 cm   Diastology LVIDs:         4.20 cm   LV e' medial:    7.98 cm/s LV PW:         0.60 cm   LV E/e' medial:  16.9 LV IVS:        0.50 cm   LV e' lateral:   9.79 cm/s LVOT diam:     1.70 cm   LV E/e' lateral: 13.7 LVOT Area:     2.27 cm  RIGHT VENTRICLE RV S prime:     14.00 cm/s TAPSE (M-mode): 1.7 cm LEFT ATRIUM             Index        RIGHT ATRIUM           Index LA diam:        4.90 cm 2.66 cm/m   RA Area:     15.50 cm LA Vol (A2C):   75.9 ml 41.22 ml/m  RA Volume:   36.90 ml  20.04 ml/m LA Vol (A4C):   93.3 ml 50.67 ml/m LA Biplane Vol: 85.0 ml 46.16 ml/m   AORTA Ao Root diam: 3.00 cm MITRAL VALVE MV Area (PHT): 4.42 cm     SHUNTS MV Peak grad:  5.7 mmHg     Systemic Diam: 1.70 cm MV Mean grad:  2.0 mmHg MV Vmax:       1.19 m/s MV Vmean:      62.8 cm/s MV Decel Time: 172 msec MV E velocity: 134.50 cm/s Lyman Bishop MD Electronically signed by Lyman Bishop MD Signature Date/Time: 10/06/2022/11:46:27 AM    Final (Updated)     Cardiac Studies   See echo above  Patient Profile     76 y.o. female with PMH of OSA, basal cell carcinoma s/p Mohs procedure, shingles with anisocoria who presented with new onset atrial fibrillation in the  setting of hyperthyroidism  Assessment & Plan    New onset of atrial fibrillation with RVR  -Echo shows low normal LVEF around 50% with incoordinated septal motion and probable mild global hypokinesis.  - HR high 90s to 110s, reasonably controlled in the setting of hyperthyroidism and PE  - current on propranolol '10mg'$  TID and short acting diltiazem '30mg'$  QID. BP does not allow up to increase rate control further, however I think the current HR is reasonably controlled. Continue Eliquis.  -Blood pressure will probably not allow increase in her medications today. - Will change propranolol to 60 mg XR daily for better rate control as short-acting propranolol has a very short half-life.  Hyperthyroidism: per primary team.  She wishes to follow-up with Dr. Carrolyn Meiers as an outpatient.  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 Eliquis '10mg'$  BID for 7 days, then '5mg'$  BID. Venous doppler engative on 10/05/2022  Would monitor an additional day as she had some right-sided chest discomfort and orthostatic symptoms today.  Transitioned over to diltiazem.  I do not think there is room to uptitrate her medicines based on her blood pressure.  Would encourage ambulation today.  If she is stable may be able to consider discharge home tomorrow.  Discussed with Dr. Sloan Leiter.   For questions or updates, please contact [place] Please consult [email or web address] for contact info under   Pixie Casino, MD, FACC, [place] Director of the [facility] Diplomate of the American Board of Clinical Lipidology Attending Cardiologist  Direct Dial: [phone]  Fax: [phone]  Website:  [email or web address]  Pixie Casino, MD  10/07/2022, 12:39 PM

## 2022-10-07 NOTE — Progress Notes (Signed)
Patient developed palpitations postdischarge.  Heart rate stable acceptable in the 90s during reevaluation.  I suspect this is mostly anxiety.  Reevaluated by cardiology as well-plan is to hold discharge and monitor for another 24 hours.  Will reassess on 11/12.

## 2022-10-07 NOTE — Discharge Summary (Addendum)
PATIENT DETAILS Name: Marissa Parker Age: 76 y.o. Sex: female Date of Birth: 1946-05-07 MRN: [id number]. Admitting Physician: Candee Furbish, MD RCV:[id number], No Pcp Per  Admit Date: 10/04/2022 Discharge date: 10/08/2022  Recommendations for Outpatient Follow-up:  Follow up with PCP in 1-2 weeks Please obtain CMP/CBC in one week Please ensure follow-up with cardiology, endocrinology Repeat thyroid function panel in a few weeks Repeat echo after restoration of sinus rhythm to assess EF/wall motion abnormalities  Admitted From:  Home  Disposition: Home   Discharge Condition: good  CODE STATUS:   Code Status: Not on file   Diet recommendation:  Diet Order             Diet - low sodium heart healthy           Diet regular Room service appropriate? Yes; Fluid consistency: Thin  Diet effective now                    Brief Summary: Patient is a 76 y.o.  female with known history of hyperthyroidism (diagnosed June 2023-wanted to try natural remedies-apparently did not tolerate PTU/propanolol subsequently)-admitted to the hospital with thyrotoxicosis and A-fib RVR, pulmonary embolism.     Significant events: 11/7>> presented to outside hospital ED with palpitations/SOB-found to have A-fib RVR, PE-concern for thyroid storm.  Given steroids, PTU, low-dose iodine. 11/8>> [facility] for thyroid storm.  Admit to ICU briefly by PCCM. 11/9>> transfer to [facility].   Significant studies: 11/8>> TSH:<0.010 11/8>> FT4: 5.23 11/9>> bilateral lower extremity Doppler: No DVT. 11/10>> Echo: EF 40-45%   Significant microbiology data: None   Procedures: None   Consults: Cardiology PCCM  Brief Hospital Course: Thyrotoxicosis Doubt thyroid storm (ruled out) Symptomatically much better-heart rate better controlled-less anxiety Initially on IV steroids/PTU/Lugol's iodine-once clinically improved-steroids/lugol's iodine discontinued.   PTU to be switched to methimazole today. Initially patient told this MD that family would arrange for outpatient endocrine follow-up, however on the day of discharge, patient/family wanted referral to endocrinology-Dr. Neita Garnet.  Epic referral placed-have asked family call Dr. Baldwin Crown office on Monday   New onset A-fib RVR Briefly required Cardizem gtt. Remains in A-fib-but rate controlled with oral Cardizem/propanolol. Discussed with cardiology-Dr. Donalda Ewings to discharge-we will change to long-acting Cardizem. Remains on Eliquis Epic message sent to cardiology team to arrange for outpatient follow-up at A-fib clinic. Echo read as EF 40-45%-numerous wall motion abnormalities, but per cardiology note from Dr. Margaretann Loveless on 11/10-Per her independent review EF 50%, with no wall motion abnormality.  She suggests that echo be repeated once sinus rhythm is restored.     Pulmonary embolism Appears unprovoked Lower extremity Dopplers negative On Eliquis Likely needs indefinite anticoagulation   OSA CPAP qhs   Anxiety Suspect this may be related to thyrotoxicosis Apparently got Valium at [facility] does not want to try benzos again. Anxiety much better today as heart rate/thyrotoxicosis symptoms get better controlled.   BMI: Estimated body mass index is 28.12 kg/m as calculated from the following:   Height as of this encounter: '5\' 5"'$  (1.651 m).   Weight as of this encounter: 76.7 kg.    Discharge Diagnoses:  Principal Problem:   Thyroid storm Active Problems:   Persistent atrial fibrillation [facility])   Hyperthyroidism  Discharge Instructions:  Activity:  As tolerated    Discharge Instructions     Ambulatory referral to Endocrinology   Complete by: As directed    Call MD for:  difficulty breathing,  headache or visual disturbances   Complete by: As directed    Diet - low sodium heart healthy   Complete by: As directed    Discharge instructions   Complete by: As directed     Follow with Primary MD  Patient, No Pcp Per in 1-2 weeks  Please get a complete blood count and chemistry panel checked by your Primary MD at your next visit, and again as instructed by your Primary MD.  Get Medicines reviewed and adjusted: Please take all your medications with you for your next visit with your Primary MD  Laboratory/radiological data: Please request your Primary MD to go over all hospital tests and procedure/radiological results at the follow up, please ask your Primary MD to get all Hospital records sent to his/her office.  In some cases, they will be blood work, cultures and biopsy results pending at the time of your discharge. Please request that your primary care M.D. follows up on these results.  Also Note the following: If you experience worsening of your admission symptoms, develop shortness of breath, life threatening emergency, suicidal or homicidal thoughts you must seek medical attention immediately by calling 911 or calling your MD immediately  if symptoms less severe.  You must read complete instructions/literature along with all the possible adverse reactions/side effects for all the Medicines you take and that have been prescribed to you. Take any new Medicines after you have completely understood and accpet all the possible adverse reactions/side effects.   Do not drive when taking Pain medications or sleeping medications (Benzodaizepines)  Do not take more than prescribed Pain, Sleep and Anxiety Medications. It is not advisable to combine anxiety,sleep and pain medications without talking with your primary care practitioner  Special Instructions: If you have smoked or chewed Tobacco  in the last 2 yrs please stop smoking, stop any regular Alcohol  and or any Recreational drug use.  Wear Seat belts while driving.  Please note: You were cared for by a hospitalist during your hospital stay. Once you are discharged, your primary care physician will handle any  further medical issues. Please note that NO REFILLS for any discharge medications will be authorized once you are discharged, as it is imperative that you return to your primary care physician (or establish a relationship with a primary care physician if you do not have one) for your post hospital discharge needs so that they can reassess your need for medications and monitor your lab values.   Increase activity slowly   Complete by: As directed       Allergies as of 10/08/2022       Reactions   Latex Hives, Itching   Adhesive [tape] Other (See Comments)   Welts   Avocado Other (See Comments)   Due to latex allergy    Bacitracin-neomycin-polymyxin Hives, Itching, Other (See Comments)   Turned skin purple    Banana Other (See Comments)   Due to latex allergy    Egg Yolk Other (See Comments)   Abdominal pain -does not take flu shot due to egg yolk allergy   Glucagon Nausea Only   Kiwi Extract Other (See Comments)   Due to latex allergy    [place] Wort Other (See Comments)   abd cramping   Diazepam Nausea And Vomiting   Meperidine Nausea Only        Medication List     STOP taking these medications    propranolol 10 MG tablet Commonly known as: INDERAL Replaced by:  propranolol ER 60 MG 24 hr capsule   Turmeric 450 MG Caps       TAKE these medications    acetaminophen 500 MG tablet Commonly known as: TYLENOL Take 1,000 mg by mouth daily as needed for moderate pain.   apixaban 5 MG Tabs tablet Commonly known as: ELIQUIS Take 2 tablets (10 mg) twice daily-on 10/11/22 evening-switch to 1 tablet (5 mg) twice daily   diltiazem 180 MG 24 hr capsule Commonly known as: CARDIZEM CD Take 1 capsule (180 mg total) by mouth daily.   EPINEPHrine 0.3 mg/0.3 mL Soaj injection Commonly known as: EpiPen 2-Pak Inject 0.3 mLs (0.3 mg total) into the muscle as needed (life-threatening allergic reaction).   fluticasone 50 MCG/ACT nasal spray Commonly known as: FLONASE Place 1  spray into both nostrils at bedtime.   KRILL OIL PO Take 1 capsule by mouth in the morning and at bedtime.   MAGNESIUM PO Take 1 tablet by mouth at bedtime.   methimazole 10 MG tablet Commonly known as: TAPAZOLE Take 2 tablets (20 mg total) by mouth daily. Start taking on: October 09, 2022   NASAL [facility] NA Place 1 Application into the nose daily.   OVER THE COUNTER MEDICATION Take 1 capsule by mouth in the morning and at bedtime. Fruits OTC vitamin   OVER THE COUNTER MEDICATION Take 1 capsule by mouth in the morning and at bedtime. Vegetables OTC vitamin   OVER THE COUNTER MEDICATION Take 1 capsule by mouth at bedtime. Medication: Syreeta   PREVAGEN PO Take 1 tablet by mouth daily.   PROBIOTIC DAILY PO Take 1 capsule by mouth daily.   propranolol ER 60 MG 24 hr capsule Commonly known as: INDERAL LA Take 1 capsule (60 mg total) by mouth daily. Start taking on: October 09, 2022 Replaces: propranolol 10 MG tablet   THIAMINE PO Take 1 capsule by mouth daily.   VITAMIN D3 PO Take 1 capsule by mouth at bedtime.   VITAMIN K PO Take 1 tablet by mouth at bedtime.        Follow-up Information     Reynold Bowen, MD Follow up.   Specialty: Endocrinology Why: Hospital follow up, Office will call with date/time, If you dont hear from them,please give them a call Contact information: [place] [address] [phone]         [facility] Follow up.   Specialty: Cardiology Why: Office will call with date/time, If you dont hear from them,please give them a call Contact information: [address] [id number] [place] [phone]        Primary Care MD. Schedule an appointment as soon as possible for a visit in 1 week(s).                 Allergies  Allergen Reactions   Latex Hives and Itching   Adhesive [Tape] Other (See Comments)    Welts   Avocado Other (See Comments)     Due to latex allergy    Bacitracin-Neomycin-Polymyxin Hives, Itching and Other (See Comments)    Turned skin purple    Banana Other (See Comments)    Due to latex allergy    Egg Yolk Other (See Comments)    Abdominal pain -does not take flu shot due to egg yolk allergy   Glucagon Nausea Only   Kiwi Extract Other (See Comments)    Due to latex allergy    St Johns Wort Other (See Comments)  abd cramping   Diazepam Nausea And Vomiting   Meperidine Nausea Only     Other Procedures/Studies: ECHOCARDIOGRAM COMPLETE  Result Date: 10/07/2022    ECHOCARDIOGRAM REPORT   Patient Name:   Tabria T Valadez Date of Exam: 10/06/2022 Medical Rec #:  [id number]  Height:       65.0 in Accession #:    [id number] Weight:       169.0 lb Date of Birth:  1946-02-15   BSA:          1.841 m Patient Age:    73 years   BP:           118/50 mmHg Patient Gender: F          HR:           115 bpm. Exam Location:  Inpatient Procedure: 2D Echo, Cardiac Doppler, Color Doppler and Intracardiac            Opacification Agent                                 MODIFIED REPORT:  This report was modified by Lyman Bishop MD on 10/07/2022 due to Re-evaluated contrast images - LVEF considered to be around 50% with mild global hypokinesis,                      complicated by afib with rapid rates.  Indications:     Afib/Pulmonary Embolus  History:         Patient has prior history of Echocardiogram examinations, most                  recent 04/27/2022. Arrythmias:Atrial Fibrillation; Risk                  Factors:Sleep Apnea and Former Smoker. Hypothyroidism.  Sonographer:     Eartha Inch Referring Phys:  [id number] Estill Cotta Diagnosing Phys: Lyman Bishop MD  Sonographer Comments: Suboptimal parasternal window. Image acquisition challenging due to patient body habitus and Image acquisition challenging due to respiratory motion. IMPRESSIONS  1. NO apical thrombus with Definity contrast. Left ventricular ejection fraction, by estimation,  is 45 to 50%. The left ventricle has mildly decreased function. The left ventricle demonstrates global hypokinesis. The left ventricular internal cavity size was mildly dilated. Left ventricular diastolic function could not be evaluated.  2. Right ventricular systolic function is low normal. The right ventricular size is normal.  3. Left atrial size was severely dilated.  4. Right atrial size was mildly dilated.  5. The mitral valve is grossly normal. Trivial mitral valve regurgitation.  6. The aortic valve is tricuspid. Aortic valve regurgitation is trivial. Comparison(s): No prior Echocardiogram. Conclusion(s)/Recommendation(s): Consider LAD territory ischemia vs. possible rate-related cardiomyopathy secondary to afib. FINDINGS  Left Ventricle: NO apical thrombus with Definity contrast. Left ventricular ejection fraction, by estimation, is 45 to 50%. The left ventricle has mildly decreased function. The left ventricle demonstrates global hypokinesis. Definity contrast agent was  given IV to delineate the left ventricular endocardial borders. The left ventricular internal cavity size was mildly dilated. There is no left ventricular hypertrophy. Left ventricular diastolic function could not be evaluated due to atrial fibrillation. Left ventricular diastolic function could not be evaluated. Right Ventricle: The right ventricular size is normal. No increase in right ventricular wall thickness. Right ventricular systolic function is low normal. Left Atrium: Left atrial size was severely dilated. Right  Atrium: Right atrial size was mildly dilated. Pericardium: There is no evidence of pericardial effusion. Mitral Valve: The mitral valve is grossly normal. Trivial mitral valve regurgitation. MV peak gradient, 5.7 mmHg. The mean mitral valve gradient is 2.0 mmHg. Tricuspid Valve: The tricuspid valve is grossly normal. Tricuspid valve regurgitation is mild. Aortic Valve: The aortic valve is tricuspid. Aortic valve  regurgitation is trivial. Pulmonic Valve: The pulmonic valve was normal in structure. Pulmonic valve regurgitation is not visualized. Aorta: The aortic root and ascending aorta are structurally normal, with no evidence of dilitation. IAS/Shunts: No atrial level shunt detected by color flow Doppler.  LEFT VENTRICLE PLAX 2D LVIDd:         5.20 cm   Diastology LVIDs:         4.20 cm   LV e' medial:    7.98 cm/s LV PW:         0.60 cm   LV E/e' medial:  16.9 LV IVS:        0.50 cm   LV e' lateral:   9.79 cm/s LVOT diam:     1.70 cm   LV E/e' lateral: 13.7 LVOT Area:     2.27 cm  RIGHT VENTRICLE RV S prime:     14.00 cm/s TAPSE (M-mode): 1.7 cm LEFT ATRIUM             Index        RIGHT ATRIUM           Index LA diam:        4.90 cm 2.66 cm/m   RA Area:     15.50 cm LA Vol (A2C):   75.9 ml 41.22 ml/m  RA Volume:   36.90 ml  20.04 ml/m LA Vol (A4C):   93.3 ml 50.67 ml/m LA Biplane Vol: 85.0 ml 46.16 ml/m   AORTA Ao Root diam: 3.00 cm MITRAL VALVE MV Area (PHT): 4.42 cm     SHUNTS MV Peak grad:  5.7 mmHg     Systemic Diam: 1.70 cm MV Mean grad:  2.0 mmHg MV Vmax:       1.19 m/s MV Vmean:      62.8 cm/s MV Decel Time: 172 msec MV E velocity: 134.50 cm/s Lyman Bishop MD Electronically signed by Lyman Bishop MD Signature Date/Time: 10/06/2022/11:46:27 AM    Final (Updated)    VAS [place] LOWER EXTREMITY VENOUS (DVT)  Result Date: 10/05/2022  Lower Venous DVT Study Patient Name:  Marissa Parker  Date of Exam:   10/05/2022 Medical Rec #: [id number]   Accession #:    [id number] Date of Birth: October 11, 1946    Patient Gender: F Patient Age:   89 years Exam Location:  [facility] Procedure:      VAS [place] LOWER EXTREMITY VENOUS (DVT) Referring Phys: [facility] ALVA --------------------------------------------------------------------------------  Indications: Pulmonary embolism.  Comparison Study: no prior Performing Technologist: Archie Patten RVS  Examination Guidelines: A complete evaluation includes B-mode imaging, spectral  Doppler, color Doppler, and power Doppler as needed of all accessible portions of each vessel. Bilateral testing is considered an integral part of a complete examination. Limited examinations for reoccurring indications may be performed as noted. The reflux portion of the exam is performed with the patient in reverse Trendelenburg.  +---------+---------------+---------+-----------+----------+--------------+ RIGHT    CompressibilityPhasicitySpontaneityPropertiesThrombus Aging +---------+---------------+---------+-----------+----------+--------------+ CFV      Full           Yes      Yes                                 +---------+---------------+---------+-----------+----------+--------------+  SFJ      Full                                                        +---------+---------------+---------+-----------+----------+--------------+ FV Prox  Full                                                        +---------+---------------+---------+-----------+----------+--------------+ FV Mid   Full                                                        +---------+---------------+---------+-----------+----------+--------------+ FV DistalFull                                                        +---------+---------------+---------+-----------+----------+--------------+ PFV      Full                                                        +---------+---------------+---------+-----------+----------+--------------+ POP      Full           Yes      Yes                                 +---------+---------------+---------+-----------+----------+--------------+ PTV      Full                                                        +---------+---------------+---------+-----------+----------+--------------+ PERO     Full                                                        +---------+---------------+---------+-----------+----------+--------------+    +--------+---------------+---------+-----------+----------+--------------------+ LEFT    CompressibilityPhasicitySpontaneityPropertiesThrombus Aging       +--------+---------------+---------+-----------+----------+--------------------+ CFV     Full           Yes      Yes                                       +--------+---------------+---------+-----------+----------+--------------------+ SFJ     Full                                                              +--------+---------------+---------+-----------+----------+--------------------+  FV Prox Full                                                              +--------+---------------+---------+-----------+----------+--------------------+ FV Mid  Full                                                              +--------+---------------+---------+-----------+----------+--------------------+ FV                     Yes      Yes                  patent by color      Distal                                               doppler              +--------+---------------+---------+-----------+----------+--------------------+ PFV     Full                                                              +--------+---------------+---------+-----------+----------+--------------------+ POP     Full           Yes      Yes                                       +--------+---------------+---------+-----------+----------+--------------------+ PTV     Full                                                              +--------+---------------+---------+-----------+----------+--------------------+ PERO    Full                                                              +--------+---------------+---------+-----------+----------+--------------------+     Summary: BILATERAL: - No evidence of deep vein thrombosis seen in the lower extremities, bilaterally. -No evidence of popliteal cyst, bilaterally.   *See table(s)  above for measurements and observations. Electronically signed by Orlie Pollen on 10/05/2022 at 2:41:43 PM.    Final    DG CHEST PORT 1 VIEW  Result Date: 10/05/2022 CLINICAL DATA:  Cough, dyspnea, PE EXAM: PORTABLE CHEST 1 VIEW COMPARISON:  None Available. FINDINGS: Slightly low lung volumes. Stable cardiomediastinal silhouette with normal heart size. No  pneumothorax. No pleural effusion. No pulmonary edema. Mild hazy right lung base opacity. IMPRESSION: Slightly low lung volumes. Mild hazy right lung base opacity, favor atelectasis, cannot exclude pneumonia or evolving pulmonary infarct given history of PE. Electronically Signed   By: Ilona Sorrel M.D.   On: 10/05/2022 08:30     TODAY-DAY OF DISCHARGE:  Subjective:   Marissa Parker today has no headache,no chest abdominal pain,no new weakness tingling or numbness, feels much better wants to go home today.   Objective:   Blood pressure 100/76, pulse (!) 112, temperature 98.4 F (36.9 C), temperature source Oral, resp. rate 16, height '5\' 5"'$  (1.651 m), weight 76.7 kg, SpO2 94 %. No intake or output data in the 24 hours ending 10/08/22 0921 Filed Weights   10/04/22 1000  Weight: 76.7 kg    Exam: Awake Alert, Oriented *3, No new F.N deficits, Normal affect [place].AT,PERRAL Supple Neck,No JVD, No cervical lymphadenopathy appriciated.  Symmetrical Chest wall movement, Good air movement bilaterally, CTAB RRR,No Gallops,Rubs or new Murmurs, No Parasternal Heave +ve B.Sounds, Abd Soft, Non tender, No organomegaly appriciated, No rebound -guarding or rigidity. No Cyanosis, Clubbing or edema, No new Rash or bruise   PERTINENT RADIOLOGIC STUDIES: ECHOCARDIOGRAM COMPLETE  Result Date: 10/07/2022    ECHOCARDIOGRAM REPORT   Patient Name:   Marissa Parker Date of Exam: 10/06/2022 Medical Rec #:  [id number]  Height:       65.0 in Accession #:    [id number] Weight:       169.0 lb Date of Birth:  [birth year]   BSA:          1.841 m Patient Age:    89 years   BP:            118/50 mmHg Patient Gender: F          HR:           115 bpm. Exam Location:  Inpatient Procedure: 2D Echo, Cardiac Doppler, Color Doppler and Intracardiac            Opacification Agent                                 MODIFIED REPORT:  This report was modified by Lyman Bishop MD on 10/07/2022 due to Re-evaluated contrast images - LVEF considered to be around 50% with mild global hypokinesis,                      complicated by afib with rapid rates.  Indications:     Afib/Pulmonary Embolus  History:         Patient has prior history of Echocardiogram examinations, most                  recent 04/27/2022. Arrythmias:Atrial Fibrillation; Risk                  Factors:Sleep Apnea and Former Smoker. Hypothyroidism.  Sonographer:     Eartha Inch Referring Phys:  [id number] Estill Cotta Diagnosing Phys: Lyman Bishop MD  Sonographer Comments: Suboptimal parasternal window. Image acquisition challenging due to patient body habitus and Image acquisition challenging due to respiratory motion. IMPRESSIONS  1. NO apical thrombus with Definity contrast. Left ventricular ejection fraction, by estimation, is 45 to 50%. The left ventricle has mildly decreased function. The left ventricle demonstrates global hypokinesis. The left ventricular internal cavity size was mildly dilated. Left ventricular diastolic  function could not be evaluated.  2. Right ventricular systolic function is low normal. The right ventricular size is normal.  3. Left atrial size was severely dilated.  4. Right atrial size was mildly dilated.  5. The mitral valve is grossly normal. Trivial mitral valve regurgitation.  6. The aortic valve is tricuspid. Aortic valve regurgitation is trivial. Comparison(s): No prior Echocardiogram. Conclusion(s)/Recommendation(s): Consider LAD territory ischemia vs. possible rate-related cardiomyopathy secondary to afib. FINDINGS  Left Ventricle: NO apical thrombus with Definity contrast. Left ventricular ejection  fraction, by estimation, is 45 to 50%. The left ventricle has mildly decreased function. The left ventricle demonstrates global hypokinesis. Definity contrast agent was  given IV to delineate the left ventricular endocardial borders. The left ventricular internal cavity size was mildly dilated. There is no left ventricular hypertrophy. Left ventricular diastolic function could not be evaluated due to atrial fibrillation. Left ventricular diastolic function could not be evaluated. Right Ventricle: The right ventricular size is normal. No increase in right ventricular wall thickness. Right ventricular systolic function is low normal. Left Atrium: Left atrial size was severely dilated. Right Atrium: Right atrial size was mildly dilated. Pericardium: There is no evidence of pericardial effusion. Mitral Valve: The mitral valve is grossly normal. Trivial mitral valve regurgitation. MV peak gradient, 5.7 mmHg. The mean mitral valve gradient is 2.0 mmHg. Tricuspid Valve: The tricuspid valve is grossly normal. Tricuspid valve regurgitation is mild. Aortic Valve: The aortic valve is tricuspid. Aortic valve regurgitation is trivial. Pulmonic Valve: The pulmonic valve was normal in structure. Pulmonic valve regurgitation is not visualized. Aorta: The aortic root and ascending aorta are structurally normal, with no evidence of dilitation. IAS/Shunts: No atrial level shunt detected by color flow Doppler.  LEFT VENTRICLE PLAX 2D LVIDd:         5.20 cm   Diastology LVIDs:         4.20 cm   LV e' medial:    7.98 cm/s LV PW:         0.60 cm   LV E/e' medial:  16.9 LV IVS:        0.50 cm   LV e' lateral:   9.79 cm/s LVOT diam:     1.70 cm   LV E/e' lateral: 13.7 LVOT Area:     2.27 cm  RIGHT VENTRICLE RV S prime:     14.00 cm/s TAPSE (M-mode): 1.7 cm LEFT ATRIUM             Index        RIGHT ATRIUM           Index LA diam:        4.90 cm 2.66 cm/m   RA Area:     15.50 cm LA Vol (A2C):   75.9 ml 41.22 ml/m  RA Volume:   36.90 ml   20.04 ml/m LA Vol (A4C):   93.3 ml 50.67 ml/m LA Biplane Vol: 85.0 ml 46.16 ml/m   AORTA Ao Root diam: 3.00 cm MITRAL VALVE MV Area (PHT): 4.42 cm     SHUNTS MV Peak grad:  5.7 mmHg     Systemic Diam: 1.70 cm MV Mean grad:  2.0 mmHg MV Vmax:       1.19 m/s MV Vmean:      62.8 cm/s MV Decel Time: 172 msec MV E velocity: 134.50 cm/s Lyman Bishop MD Electronically signed by Lyman Bishop MD Signature Date/Time: 10/06/2022/11:46:27 AM    Final (Updated)      PERTINENT LAB  RESULTS: CBC: Recent Labs    10/06/22 0223 10/07/22 0140  WBC 5.9 7.8  HGB 11.7* 11.7*  HCT 35.1* 36.1  PLT 157 163   CMET CMP     Component Value Date/Time   NA 140 10/06/2022 0223   K 4.5 10/06/2022 0223   CL 109 10/06/2022 0223   CO2 25 10/06/2022 0223   GLUCOSE 133 (H) 10/06/2022 0223   BUN 24 (H) 10/06/2022 0223   CREATININE 0.61 10/06/2022 0223   CALCIUM 9.2 10/06/2022 0223   PROT 6.3 (L) 10/04/2022 1140   ALBUMIN 2.5 (L) 10/04/2022 1140   AST 43 (H) 10/04/2022 1140   ALT 40 10/04/2022 1140   ALKPHOS 85 10/04/2022 1140   BILITOT 0.7 10/04/2022 1140   GFRNONAA >60 10/06/2022 0223    GFR Estimated Creatinine Clearance: 61.3 mL/min (by C-G formula based on SCr of 0.61 mg/dL). No results for input(s): "LIPASE", "AMYLASE" in the last 72 hours. No results for input(s): "CKTOTAL", "CKMB", "CKMBINDEX", "TROPONINI" in the last 72 hours. Invalid input(s): "POCBNP" No results for input(s): "DDIMER" in the last 72 hours.  No results for input(s): "HGBA1C" in the last 72 hours. No results for input(s): "CHOL", "HDL", "LDLCALC", "TRIG", "CHOLHDL", "LDLDIRECT" in the last 72 hours. No results for input(s): "TSH", "T4TOTAL", "T3FREE", "THYROIDAB" in the last 72 hours.  Invalid input(s): "FREET3"  No results for input(s): "VITAMINB12", "FOLATE", "FERRITIN", "TIBC", "IRON", "RETICCTPCT" in the last 72 hours. Coags: No results for input(s): "INR" in the last 72 hours.  Invalid input(s):  "PT" Microbiology: Recent Results (from the past 240 hour(s))  MRSA Next Gen by PCR, Nasal     Status: None   Collection Time: 10/04/22 11:05 AM   Specimen: Nasal Mucosa; Nasal Swab  Result Value Ref Range Status   MRSA by PCR Next Gen NOT DETECTED NOT DETECTED Final    Comment: (NOTE) The GeneXpert MRSA Assay (FDA approved for NASAL specimens only), is one component of a comprehensive MRSA colonization surveillance program. It is not intended to diagnose MRSA infection nor to guide or monitor treatment for MRSA infections. Test performance is not FDA approved in patients less than 20 years old. Performed at [facility], [place] [address], [place], [place] [address]     FURTHER DISCHARGE INSTRUCTIONS:  Get Medicines reviewed and adjusted: Please take all your medications with you for your next visit with your Primary MD  Laboratory/radiological data: Please request your Primary MD to go over all hospital tests and procedure/radiological results at the follow up, please ask your Primary MD to get all Hospital records sent to his/her office.  In some cases, they will be blood work, cultures and biopsy results pending at the time of your discharge. Please request that your primary care M.D. goes through all the records of your hospital data and follows up on these results.  Also Note the following: If you experience worsening of your admission symptoms, develop shortness of breath, life threatening emergency, suicidal or homicidal thoughts you must seek medical attention immediately by calling 911 or calling your MD immediately  if symptoms less severe.  You must read complete instructions/literature along with all the possible adverse reactions/side effects for all the Medicines you take and that have been prescribed to you. Take any new Medicines after you have completely understood and accpet all the possible adverse reactions/side effects.   Do not drive when taking Pain  medications or sleeping medications (Benzodaizepines)  Do not take more than prescribed Pain, Sleep and  Anxiety Medications. It is not advisable to combine anxiety,sleep and pain medications without talking with your primary care practitioner  Special Instructions: If you have smoked or chewed Tobacco  in the last 2 yrs please stop smoking, stop any regular Alcohol  and or any Recreational drug use.  Wear Seat belts while driving.  Please note: You were cared for by a hospitalist during your hospital stay. Once you are discharged, your primary care physician will handle any further medical issues. Please note that NO REFILLS for any discharge medications will be authorized once you are discharged, as it is imperative that you return to your primary care physician (or establish a relationship with a primary care physician if you do not have one) for your post hospital discharge needs so that they can reassess your need for medications and monitor your lab values.  Total Time spent coordinating discharge including counseling, education and face to face time equals greater than 30 minutes.  SignedOren Binet 10/08/2022 9:21 AM

## 2022-10-07 NOTE — Progress Notes (Signed)
Pt's son Marissa Parker called concerned about pt being discharged. Stated that pt reported to him that she feels worst today and having palpitations. He would like for cardio to see pt again. He would like for cardio to see pt again. Dr. Sloan Leiter made aware of concerns.

## 2022-10-08 LAB — GLUCOSE, CAPILLARY
Glucose-Capillary: 158 mg/dL — ABNORMAL HIGH (ref 70–99)
Glucose-Capillary: 104 mg/dL — ABNORMAL HIGH (ref 70–99)

## 2022-10-08 MED ORDER — PROPRANOLOL HCL ER 60 MG PO CP24: 60.0000 mg | ORAL_CAPSULE | Freq: Every day | ORAL | 1 refills | Status: DC

## 2022-10-08 MED ORDER — METHIMAZOLE 10 MG PO TABS: 20.0000 mg | ORAL_TABLET | Freq: Every day | ORAL | 1 refills | Status: DC

## 2022-10-08 NOTE — Progress Notes (Signed)
Seen and examined Feels better-smiling this morning-no palpitations Heart rate fluctuating-anywhere from 80s to low 100s BP soft-low 100s range.  Blood pressure 100/76, pulse (!) 112, temperature 98.4 F (36.9 C), temperature source Oral, resp. rate 16, height '5\' 5"'$  (1.651 m), weight 76.7 kg, SpO2 94 %.   Await cardiology input but suspect should be stable for discharge today. Please see discharge summary-I have updated her medications.

## 2022-10-08 NOTE — Progress Notes (Signed)
Rounding Note    Patient Name: Marissa Parker Date of Encounter: 10/08/2022  [place] Cardiologist: None   Subjective   Feels much better today - I switcher her over to propranolol extended release yesterday. Also on diltiazem extended release. HR has been better controlled- says her palpitations are better.  Inpatient Medications    Scheduled Meds:  apixaban  10 mg Oral BID   Followed by   Derrill Memo ON 10/11/2022] apixaban  5 mg Oral BID   diltiazem  180 mg Oral Daily   methimazole  20 mg Oral Daily   propranolol ER  60 mg Oral Daily   Continuous Infusions:  PRN Meds: acetaminophen, metoprolol tartrate   Vital Signs    Vitals:   10/07/22 1602 10/07/22 1603 10/07/22 1900 10/08/22 0800  BP:   105/68 100/76  Pulse: 98  63 (!) 112  Resp:  '19 20 16  '$ Temp:   98.4 F (36.9 C)   TempSrc:   Oral   SpO2:   93% 94%  Weight:      Height:       No intake or output data in the 24 hours ending 10/08/22 1059     10/04/2022   10:00 AM 02/24/2016    1:18 PM 01/20/2016    2:45 PM  Last 3 Weights  Weight (lbs) 169 lb 162 lb 162 lb 3.2 oz  Weight (kg) 76.658 kg 73.483 kg 73.573 kg      Telemetry    Atrial fibrillation with HR high 90s to 110s at rest, with any activity HR increased to 130s - Personally Reviewed  ECG    Atrial fibrillation with RVR - Personally Reviewed  Physical Exam   GEN: No acute distress.   Neck: No JVD Cardiac: irregularly irregular, no murmurs, rubs, or gallops.  Respiratory: Clear to auscultation bilaterally. GI: Soft, nontender, non-distended  MS: No edema; No deformity. Neuro:  Nonfocal  Psych: Normal affect   Labs    High Sensitivity Troponin:   Recent Labs  Lab 10/04/22 1140  TROPONINIHS 8     Chemistry Recent Labs  Lab 10/04/22 1140 10/05/22 0453 10/06/22 0223  NA 140 141 140  K 3.9 4.2 4.5  CL 106 109 109  CO2 '24 25 25  '$ GLUCOSE 119* 123* 133*  BUN 14 17 24*  CREATININE 0.45 0.51 0.61  CALCIUM 9.4 9.4 9.2   MG 1.9 2.1  --   PROT 6.3*  --   --   ALBUMIN 2.5*  --   --   AST 43*  --   --   ALT 40  --   --   ALKPHOS 85  --   --   BILITOT 0.7  --   --   GFRNONAA >60 >60 >60  ANIONGAP '10 7 6    '$ Lipids No results for input(s): "CHOL", "TRIG", "HDL", "LABVLDL", "LDLCALC", "CHOLHDL" in the last 168 hours.  Hematology Recent Labs  Lab 10/05/22 0453 10/06/22 0223 10/07/22 0140  WBC 6.3 5.9 7.8  RBC 3.83* 3.55* 3.59*  HGB 12.7 11.7* 11.7*  HCT 37.4 35.1* 36.1  MCV 97.7 98.9 100.6*  MCH 33.2 33.0 32.6  MCHC 34.0 33.3 32.4  RDW 12.9 13.0 13.0  PLT 166 157 163   Thyroid  Recent Labs  Lab 10/04/22 1140  TSH <0.010*  FREET4 5.23*    BNP Recent Labs  Lab 10/04/22 1140  BNP 853.8*    DDimer  Recent Labs  Lab 10/04/22 1140  DDIMER  2.81*     Radiology    No results found.  Cardiac Studies   See echo above  Patient Profile     76 y.o. female with PMH of OSA, basal cell carcinoma s/p Mohs procedure, shingles with anisocoria who presented with new onset atrial fibrillation in the setting of hyperthyroidism  Assessment & Plan    New onset of atrial fibrillation with RVR  -Echo shows low normal LVEF around 50% with incoordinated septal motion and probable mild global hypokinesis.  - HR high 90s to 110s, reasonably controlled in the setting of hyperthyroidism and PE  - current on propranolol '10mg'$  TID and short acting diltiazem '30mg'$  QID. BP does not allow up to increase rate control further, however I think the current HR is reasonably controlled. Continue Eliquis.  -Blood pressure will probably not allow increase in her medications today. - Continue propranolol to 60 mg XR daily in addition to diltiazem 180 CD daily.  Hyperthyroidism: per primary team.  She wishes to follow-up with Dr. Carrolyn Meiers as an outpatient.  PE: CTA at OSH showed multiple emboli in the segmental arteries of RLL. Eliquis '10mg'$  BID for 7 days, then '5mg'$  BID. Venous doppler engative on 10/05/2022  Ok to  d/c home today. Plan follow-up with the afib clinic after discharge, will need appt referral as I do not see one has been made.   For questions or updates, please contact [place] Please consult [email or web address] for contact info under   Pixie Casino, MD, FACC, [place] Director of the [facility]  Card[facility] Clinic Diplomate of the American Board of Clinical Lipidology Attending Cardiologist  Direct Dial: [phone]  Fax: [phone]  Website:  [email or web address]  Pixie Casino, MD  10/08/2022, 10:59 AM

## 2022-10-16 ENCOUNTER — Ambulatory Visit (HOSPITAL_COMMUNITY)
Admission: RE | Admit: 2022-10-16 | Discharge: 2022-10-16 | Disposition: A | Discharge status: Home / Self Care | Payer: Medicare Other | Source: Ambulatory Visit | Attending: Physician Assistant | Admitting: Physician Assistant

## 2022-10-16 VITALS — BP 140/64 | HR 127 | Ht 65.0 in | Wt 167.0 lb

## 2022-10-16 DIAGNOSIS — C792 Secondary malignant neoplasm of skin: Secondary | ICD-10-CM | POA: Insufficient documentation

## 2022-10-16 DIAGNOSIS — E0590 Thyrotoxicosis, unspecified without thyrotoxic crisis or storm: Secondary | ICD-10-CM | POA: Diagnosis not present

## 2022-10-16 DIAGNOSIS — Z7901 Long term (current) use of anticoagulants: Secondary | ICD-10-CM | POA: Insufficient documentation

## 2022-10-16 DIAGNOSIS — G4733 Obstructive sleep apnea (adult) (pediatric): Secondary | ICD-10-CM | POA: Diagnosis not present

## 2022-10-16 DIAGNOSIS — D6869 Other thrombophilia: Secondary | ICD-10-CM | POA: Insufficient documentation

## 2022-10-16 DIAGNOSIS — I4819 Other persistent atrial fibrillation: Secondary | ICD-10-CM | POA: Insufficient documentation

## 2022-10-16 MED ORDER — APIXABAN 5 MG PO TABS: 5.0000 mg | ORAL_TABLET | Freq: Two times a day (BID) | ORAL | 1 refills | Status: DC

## 2022-10-16 MED ORDER — PROPRANOLOL HCL ER 60 MG PO CP24: 60.0000 mg | ORAL_CAPSULE | Freq: Every day | ORAL | 1 refills | Status: DC

## 2022-10-16 MED ORDER — DILTIAZEM HCL ER COATED BEADS 180 MG PO CP24: 180.0000 mg | ORAL_CAPSULE | Freq: Every day | ORAL | 1 refills | Status: DC

## 2022-10-16 NOTE — Progress Notes (Signed)
Primary Care Physician: Patient, No Pcp Per Primary Cardiologist: none Primary Electrophysiologist: none Referring Physician: Dr Marissa Parker is a 76 y.o. female with a history of OSA, basal cell carcinoma, hyperthyroidism, atrial fibrillation who presents for consultation in the [facility]. Patient presented to [facility] on 09/[facility]/22 with symptoms of palpitations and shortness of breath and was found to be in afib (unclear if RVR or not). Patient was diagnosed with hyperthyroidism in 04/2022. Initial management was with homeopathic regimen but by October, symptoms were worsening. Prescription therapy initiated with propranolol and PTU. Unclear if atrial fibrillation was noted on any physical exam. At outside hospital, CTA chest found multiple emboli in segmental arteries of right lower lobe. Patient is on Eliquis for a CHADS2VASC score of 3. She was discharged on propranolol and diltiazem for rate control. Today, patient reports that she feels fatigued but is still able to complete her activities of daily living. Her heart rate at home is typically 46E-70J with a systolic BP ~500-938.   Today, she denies symptoms of palpitations, chest pain, shortness of breath, orthopnea, PND, lower extremity edema, dizziness, presyncope, syncope, bleeding, or neurologic sequela. The patient is tolerating medications without difficulties and is otherwise without complaint today.    Atrial Fibrillation Risk Factors:  she does have symptoms or diagnosis of sleep apnea. she is compliant with CPAP therapy. she does not have a history of rheumatic fever.   she has a BMI of Body mass index is 27.79 kg/m.Marland Kitchen Filed Weights   10/16/22 1439  Weight: 75.8 kg    Family History  Problem Relation Age of Onset   Heart attack Mother    Aneurysm Mother    Colon cancer Father      Atrial Fibrillation Management history:  Previous antiarrhythmic drugs:  none Previous cardioversions: none Previous ablations: none CHADS2VASC score: 3 Anticoagulation history: Eliquis   Past Medical History:  Diagnosis Date   Allergy    Cancer ([place])    basil cell ca skin   History of shingles    eyes   Past Surgical History:  Procedure Laterality Date   APPENDECTOMY     DILATION AND CURETTAGE OF UTERUS     KNEE ARTHROSCOPY     left   MOHS SURGERY     face and leg   TONSILLECTOMY     age 4 and 19    Current Outpatient Medications  Medication Sig Dispense Refill   acetaminophen (TYLENOL) 500 MG tablet Take 1,000 mg by mouth daily as needed for moderate pain.     Bioflavonoid Products (QUERCETIN COMPLEX IMMUNE PO) Take 1 capsule by mouth in the morning and at bedtime.     Cholecalciferol (VITAMIN D3 PO) Take 1 capsule by mouth at bedtime.     ELDERBERRY PO Take by mouth. Taking 1 gummy by mouth daily     EPINEPHrine (EPIPEN 2-PAK) 0.3 mg/0.3 mL IJ SOAJ injection Inject 0.3 mLs (0.3 mg total) into the muscle as needed (life-threatening allergic reaction). 2 Device 2   fluticasone (FLONASE) 50 MCG/ACT nasal spray Place 1 spray into both nostrils at bedtime.     KRILL OIL PO Take 1 capsule by mouth in the morning and at bedtime.     MAGNESIUM PO Take 1 tablet by mouth at bedtime.     methimazole (TAPAZOLE) 10 MG tablet Take 2 tablets (20 mg total) by mouth daily. 30 tablet 1   Multiple Vitamin (MULTIVITAMIN) tablet  Taking one capsule by mouth daily Doterra - TerraZyme Digestive Enzyme Complex     Multiple Vitamins-Minerals (PRESERVISION AREDS 2 PO) Take 1 capsule by mouth every morning.     NASAL Cassia NA Place 1 Application into the nose daily.     OVER THE COUNTER MEDICATION Take 1 capsule by mouth in the morning and at bedtime. Fruits OTC vitamin     OVER THE COUNTER MEDICATION Take 1 capsule by mouth in the morning and at bedtime. Vegetables OTC vitamin     OVER THE COUNTER MEDICATION Take 1 capsule by mouth at bedtime. Medication: Serenity      Probiotic Product (PROBIOTIC DAILY PO) Take 1 capsule by mouth daily.     Thiamine HCl (THIAMINE PO) Take 1 capsule by mouth daily.     Turmeric (QC TUMERIC COMPLEX) 500 MG CAPS Take 1 capsule by mouth at bedtime.     VITAMIN K PO Take 1 tablet by mouth at bedtime.     apixaban (ELIQUIS) 5 MG TABS tablet Take 1 tablet (5 mg total) by mouth 2 (two) times daily. 180 tablet 1   diltiazem (CARDIZEM CD) 180 MG 24 hr capsule Take 1 capsule (180 mg total) by mouth daily. 90 capsule 1   propranolol ER (INDERAL LA) 60 MG 24 hr capsule Take 1 capsule (60 mg total) by mouth daily. 90 capsule 1   No current facility-administered medications for this encounter.    Allergies  Allergen Reactions   Latex Hives and Itching   Adhesive [Tape] Other (See Comments)    Welts   Avocado Other (See Comments)    Due to latex allergy    Bacitracin-Neomycin-Polymyxin Hives, Itching and Other (See Comments)    Turned skin purple    Banana Other (See Comments)    Due to latex allergy    Egg Yolk Other (See Comments)    Abdominal pain -does not take flu shot due to egg yolk allergy   Glucagon Nausea Only   Kiwi Extract Other (See Comments)    Due to latex allergy    [place] Wort Other (See Comments)    abd cramping   Diazepam Nausea And Vomiting   Meperidine Nausea Only    Social History   Socioeconomic History   Marital status: Married    Spouse name: Not on file   Number of children: Not on file   Years of education: Not on file   Highest education level: Not on file  Occupational History   Not on file  Tobacco Use   Smoking status: Former    Types: Cigarettes    Quit date: 11/27/1968    Years since quitting: 53.9   Smokeless tobacco: Never  Substance and Sexual Activity   Alcohol use: Yes   Drug use: No   Sexual activity: Not on file  Other Topics Concern   Not on file  Social History Narrative   Not on file   Social Determinants of Health   Financial Resource Strain: Not on file   Food Insecurity: Not on file  Transportation Needs: Not on file  Physical Activity: Not on file  Stress: Not on file  Social Connections: Not on file  Intimate Partner Violence: Not on file     ROS- All systems are reviewed and negative except as per the HPI above.  Physical Exam: Vitals:   10/16/22 1439  BP: (!) 140/64  Pulse: (!) 127  Weight: 75.8 kg  Height: '5\' 5"'$  (1.651 m)  GEN- The patient is a well appearing elderly female, alert and oriented x 3 today.   Head- normocephalic, atraumatic Eyes-  Sclera clear, conjunctiva pink Ears- hearing intact Oropharynx- clear Neck- supple  Lungs- Clear to ausculation bilaterally, normal work of breathing Heart- irregular rate and rhythm, no murmurs, rubs or gallops  GI- soft, NT, ND, + BS Extremities- no clubbing, cyanosis, or edema MS- no significant deformity or atrophy Skin- no rash or lesion Psych- euthymic mood, full affect Neuro- strength and sensation are intact  Wt Readings from Last 3 Encounters:  10/16/22 75.8 kg  10/04/22 76.7 kg  02/24/16 73.5 kg    EKG today demonstrates  Afib with RVR Vent. rate 127 BPM PR interval * ms QRS duration 98 ms QT/QTcB 324/470 ms  Echo 10/06/22 demonstrated   1. NO apical thrombus with Definity contrast. Left ventricular ejection  fraction, by estimation, is 45 to 50%. The left ventricle has mildly  decreased function. The left ventricle demonstrates global hypokinesis.  The left ventricular internal cavity size was mildly dilated. Left ventricular diastolic function could not be evaluated.   2. Right ventricular systolic function is low normal. The right  ventricular size is normal.   3. Left atrial size was severely dilated.   4. Right atrial size was mildly dilated.   5. The mitral valve is grossly normal. Trivial mitral valve  regurgitation.   6. The aortic valve is tricuspid. Aortic valve regurgitation is trivial.   Comparison(s): No prior Echocardiogram.    Conclusion(s)/Recommendation(s): Consider LAD territory ischemia vs.  possible rate-related cardiomyopathy secondary to afib.   Epic records are reviewed at length today  CHA2DS2-VASc Score = 3  The patient's score is based upon: CHF History: 0 HTN History: 0 Diabetes History: 0 Stroke History: 0 Vascular Disease History: 0 Age Score: 2 Gender Score: 1       ASSESSMENT AND PLAN: 1. Persistent Atrial Fibrillation (ICD10:  I48.19) The patient's CHA2DS2-VASc score is 3, indicating a 3.2% annual risk of stroke.   Patient remains in afib, hear rates better controlled at home, no room to increase rate control meds. If she remains in afib despite thyroid treatment she may require DCCV.  Continue diltiazem 180 mg daily Continue propranolol 60 mg daily Continue Eliquis 5 mg BID  2. Secondary Hypercoagulable State (ICD10:  D68.69) The patient is at significant risk for stroke/thromboembolism based upon her CHA2DS2-VASc Score of 3.  Continue Apixaban (Eliquis).   3. Hyperthyroidism  On methimazole Has appointment to establish with endocrinologist 11/01/22.  4. Obstructive sleep apnea The importance of adequate treatment of sleep apnea was discussed today in order to improve our ability to maintain sinus rhythm long term. Encouraged compliance with CPAP therapy.    Follow up to establish care with a local cardiologist in [place], [place]. AF clinic as needed.    [facility] [address] [place], [place] [address] [phone] 10/16/2022 4:28 PM

## 2022-12-27 ENCOUNTER — Telehealth (HOSPITAL_COMMUNITY): Payer: Self-pay | Admitting: *Deleted

## 2022-12-27 NOTE — Telephone Encounter (Signed)
Pt called in stating she is having facial swelling, ear pressure that radiates to her neck after taking her medications. She notices this everyday about an hour after cardizem.  Pt heart rates in the 60-70s. Thyroid is better controlled per endo.  Pt tried decreasing propranolol slowly over the last several weeks but this has not helped her symptoms. Discussed with Adline Peals PA will stop cardizem and keep her propanolol at '40mg'$  a day. Pt will follow up with her primary cardiologist if symptoms persist. Pt verbalized agreement.

## 2023-01-01 NOTE — Addendum Note (Signed)
Addended by: Juluis Mire on: 01/01/2023 11:14 AM   Modules accepted: Orders

## 2023-01-01 NOTE — Telephone Encounter (Addendum)
Pt continues with symptoms slightly better off cardizem but after taking propranolol she notices the symptoms about an hour later. HRs in the 80s. Discussed with Adline Peals PA will stop propranolol now that thyroid has normalized. Pt will continue to monitor her HR/BP. Has follow up with cardiologist in [place] end of month.

## 2023-01-04 ENCOUNTER — Other Ambulatory Visit: Payer: Self-pay

## 2023-01-04 ENCOUNTER — Emergency Department (HOSPITAL_COMMUNITY)
Admission: EM | Admit: 2023-01-04 | Discharge: 2023-01-04 | Disposition: A | Discharge status: Home / Self Care | Payer: Medicare Other | Attending: Emergency Medicine | Admitting: Emergency Medicine

## 2023-01-04 ENCOUNTER — Ambulatory Visit (HOSPITAL_BASED_OUTPATIENT_CLINIC_OR_DEPARTMENT_OTHER)
Admission: RE | Admit: 2023-01-04 | Discharge: 2023-01-04 | Disposition: A | Discharge status: Home / Self Care | Payer: Medicare Other | Source: Ambulatory Visit | Attending: Physician Assistant | Admitting: Physician Assistant

## 2023-01-04 ENCOUNTER — Encounter (HOSPITAL_COMMUNITY): Payer: Self-pay | Admitting: Physician Assistant

## 2023-01-04 ENCOUNTER — Emergency Department (HOSPITAL_COMMUNITY): Payer: Medicare Other

## 2023-01-04 VITALS — BP 148/110 | HR 171 | Ht 65.0 in | Wt 182.2 lb

## 2023-01-04 DIAGNOSIS — I4891 Unspecified atrial fibrillation: Secondary | ICD-10-CM | POA: Diagnosis present

## 2023-01-04 DIAGNOSIS — Z91040 Latex allergy status: Secondary | ICD-10-CM | POA: Diagnosis not present

## 2023-01-04 DIAGNOSIS — I4819 Other persistent atrial fibrillation: Secondary | ICD-10-CM

## 2023-01-04 DIAGNOSIS — R0602 Shortness of breath: Secondary | ICD-10-CM | POA: Insufficient documentation

## 2023-01-04 DIAGNOSIS — E0590 Thyrotoxicosis, unspecified without thyrotoxic crisis or storm: Secondary | ICD-10-CM | POA: Insufficient documentation

## 2023-01-04 DIAGNOSIS — D6869 Other thrombophilia: Secondary | ICD-10-CM | POA: Insufficient documentation

## 2023-01-04 DIAGNOSIS — Z7901 Long term (current) use of anticoagulants: Secondary | ICD-10-CM | POA: Insufficient documentation

## 2023-01-04 DIAGNOSIS — R5383 Other fatigue: Secondary | ICD-10-CM | POA: Insufficient documentation

## 2023-01-04 DIAGNOSIS — Z79899 Other long term (current) drug therapy: Secondary | ICD-10-CM | POA: Insufficient documentation

## 2023-01-04 DIAGNOSIS — G4733 Obstructive sleep apnea (adult) (pediatric): Secondary | ICD-10-CM | POA: Insufficient documentation

## 2023-01-04 LAB — BASIC METABOLIC PANEL
Anion gap: 12 (ref 5–15)
BUN: 16 mg/dL (ref 8–23)
CO2: 22 mmol/L (ref 22–32)
Calcium: 9.1 mg/dL (ref 8.9–10.3)
Chloride: 104 mmol/L (ref 98–111)
Creatinine, Ser: 0.86 mg/dL (ref 0.44–1.00)
GFR, Estimated: >60 mL/min (ref >60)
Glucose, Bld: 112 mg/dL — ABNORMAL HIGH (ref 70–99)
Potassium: 3.9 mmol/L (ref 3.5–5.1)
Sodium: 138 mmol/L (ref 135–145)

## 2023-01-04 LAB — TSH: TSH: 4.046 u[IU]/mL (ref 0.350–4.500)

## 2023-01-04 LAB — CBC WITH DIFFERENTIAL/PLATELET
Abs Immature Granulocytes: 0.03 10*3/uL (ref 0.00–0.07)
Basophils Absolute: 0 10*3/uL (ref 0.0–0.1)
Basophils Relative: 0 %
Eosinophils Absolute: 0.1 10*3/uL (ref 0.0–0.5)
Eosinophils Relative: 2 %
HCT: 46.3 % — ABNORMAL HIGH (ref 36.0–46.0)
Hemoglobin: 15.7 g/dL — ABNORMAL HIGH (ref 12.0–15.0)
Immature Granulocytes: 1 %
Lymphocytes Relative: 30 %
Lymphs Abs: 1.5 10*3/uL (ref 0.7–4.0)
MCH: 34.4 pg — ABNORMAL HIGH (ref 26.0–34.0)
MCHC: 33.9 g/dL (ref 30.0–36.0)
MCV: 101.5 fL — ABNORMAL HIGH (ref 80.0–100.0)
Monocytes Absolute: 0.4 10*3/uL (ref 0.1–1.0)
Monocytes Relative: 9 %
Neutro Abs: 3 10*3/uL (ref 1.7–7.7)
Neutrophils Relative %: 58 %
Platelets: 161 10*3/uL (ref 150–400)
RBC: 4.56 MIL/uL (ref 3.87–5.11)
RDW: 16.7 % — ABNORMAL HIGH (ref 11.5–15.5)
WBC: 5.1 10*3/uL (ref 4.0–10.5)
nRBC: 0 % (ref 0.0–0.2)

## 2023-01-04 MED ORDER — PROPOFOL 10 MG/ML IV BOLUS
INTRAVENOUS | Status: AC | PRN
  Administered 2023-01-04 (×2): 20 mg via INTRAVENOUS

## 2023-01-04 MED ORDER — PROPOFOL 10 MG/ML IV BOLUS
0.5000 mg/kg | Freq: Once | INTRAVENOUS | Status: DC
  Filled 2023-01-04: qty 20

## 2023-01-04 MED ORDER — DILTIAZEM HCL-DEXTROSE 125-5 MG/125ML-% IV SOLN (PREMIX)
5.0000 mg/h | INTRAVENOUS | Status: DC
  Administered 2023-01-04: 5 mg/h via INTRAVENOUS
  Filled 2023-01-04: qty 125

## 2023-01-04 NOTE — ED Provider Notes (Signed)
[facility] Provider Note   CSN: [id number] Arrival date & time: 01/04/23  7209     History  Chief Complaint  Patient presents with   Atrial Fibrillation    Marissa Parker is a 77 y.o. female.  77 year old female with prior medical history as detailed below presents from A-fib clinic.  Patient was sent to ED for evaluation and cardioversion.  Patient with minimally symptomatic A-fib with RVR.  Patient is on Eliquis and reports full compliance with same.  Patient's last p.o. intake was around 7 AM today.  The history is provided by the patient and medical records.       Home Medications Prior to Admission medications   Medication Sig Start Date End Date Taking? Authorizing Provider  acetaminophen (TYLENOL) 500 MG tablet Take 1,000 mg by mouth daily as needed for moderate pain.    [provider]  apixaban (ELIQUIS) 5 MG TABS tablet Take 1 tablet (5 mg total) by mouth 2 (two) times daily. 10/16/22   Fenton, Clint R, PA  Bioflavonoid Products (QUERCETIN COMPLEX IMMUNE PO) Take 1 capsule by mouth in the morning and at bedtime.    [provider]  Cholecalciferol (VITAMIN D3 PO) Take 1 capsule by mouth at bedtime.    [provider]  ELDERBERRY PO Take by mouth. Taking 1 gummy by mouth daily    [provider]  EPINEPHrine (EPIPEN 2-PAK) 0.3 mg/0.3 mL IJ SOAJ injection Inject 0.3 mLs (0.3 mg total) into the muscle as needed (life-threatening allergic reaction). Patient not taking: Reported on 01/04/2023 10/26/15   Jiles Prows, MD  fluticasone [facility]) 50 MCG/ACT nasal spray Place 1 spray into both nostrils at bedtime.    [provider]  KRILL OIL PO Take 1 capsule by mouth in the morning and at bedtime.    [provider]  MAGNESIUM PO Take 1 tablet by mouth at bedtime.    [provider]  methimazole (TAPAZOLE) 10 MG tablet Take 2 tablets (20 mg total) by mouth daily.  10/09/22   Ghimire, Henreitta Leber, MD  Multiple Vitamin (MULTIVITAMIN) tablet Taking one capsule by mouth daily Doterra - TerraZyme Digestive Enzyme Complex    [provider]  Multiple Vitamins-Minerals (PRESERVISION AREDS 2 PO) Take 1 capsule by mouth every morning.    [provider]  NASAL [facility] NA Place 1 Application into the nose daily.    [provider]  OVER THE COUNTER MEDICATION Take 1 capsule by mouth in the morning and at bedtime. Fruits OTC vitamin    [provider]  OVER THE COUNTER MEDICATION Take 1 capsule by mouth in the morning and at bedtime. Vegetables OTC vitamin    [provider]  OVER THE COUNTER MEDICATION Take 1 capsule by mouth at bedtime. Medication: Serenity    [provider]  Probiotic Product (PROBIOTIC DAILY PO) Take 1 capsule by mouth daily.    [provider]  Thiamine HCl (THIAMINE PO) Take 1 capsule by mouth daily.    [provider]  Turmeric (QC TUMERIC COMPLEX) 500 MG CAPS Take 1 capsule by mouth at bedtime.    [provider]  VITAMIN K PO Take 1 tablet by mouth at bedtime.    [provider]      Allergies    Latex, Adhesive [tape], Avocado, Bacitracin-neomycin-polymyxin, Banana, Egg yolk, Glucagon, Kiwi extract, St johns wort, Diazepam, and Meperidine    Review of Systems   Review  of Systems  All other systems reviewed and are negative.   Physical Exam Updated Vital Signs BP (!) 150/120   Pulse (!) 173   Temp (!) 95 F (35 C) (Temporal)   Resp (!) 32   SpO2 93%  Physical Exam Vitals and nursing note reviewed.  Constitutional:      General: She is not in acute distress.    Appearance: Normal appearance. She is well-developed.  HENT:     Head: Normocephalic and atraumatic.  Eyes:     Conjunctiva/sclera: Conjunctivae normal.     Pupils: Pupils are equal, round, and reactive to light.  Cardiovascular:     Rate and Rhythm: Tachycardia present. Rhythm  irregular.     Heart sounds: Normal heart sounds.  Pulmonary:     Effort: Pulmonary effort is normal. No respiratory distress.     Breath sounds: Normal breath sounds.  Abdominal:     General: There is no distension.     Palpations: Abdomen is soft.     Tenderness: There is no abdominal tenderness.  Musculoskeletal:        General: No deformity. Normal range of motion.     Cervical back: Normal range of motion and neck supple.  Skin:    General: Skin is warm and dry.  Neurological:     General: No focal deficit present.     Mental Status: She is alert and oriented to person, place, and time.     ED Results / Procedures / Treatments   Labs (all labs ordered are listed, but only abnormal results are displayed) Labs Reviewed  CBC WITH DIFFERENTIAL/PLATELET - Abnormal; Notable for the following components:      Result Value   Hemoglobin 15.7 (*)    HCT 46.3 (*)    MCV 101.5 (*)    MCH 34.4 (*)    RDW 16.7 (*)    All other components within normal limits  BASIC METABOLIC PANEL - Abnormal; Notable for the following components:   Glucose, Bld 112 (*)    All other components within normal limits  TSH  I-STAT CHEM 8, ED    EKG EKG Interpretation  Date/Time:  Thursday January 04 2023 11:43:00 EST Ventricular Rate:  81 PR Interval:  190 QRS Duration: 110 QT Interval:  379 QTC Calculation: 440 R Axis:   47 Text Interpretation: Sinus rhythm Atrial premature complexes Confirmed by Dene Gentry [phone]) on 01/04/2023 11:47:30 AM  Radiology DG Chest Port 1 View  Result Date: 01/04/2023 CLINICAL DATA:  sob EXAM: PORTABLE CHEST - 1 VIEW COMPARISON:  10/05/2022 FINDINGS: Cardiac silhouette is prominent. There is pulmonary interstitial prominence with vascular congestion. No focal consolidation. No pneumothorax or pleural effusion identified. Aorta is calcified. IMPRESSION: Findings suggest CHF. Electronically Signed   By: Sammie Bench M.D.   On: 01/04/2023 11:06     Procedures .Cardioversion  Date/Time: 01/04/2023 12:22 PM  Performed by: Valarie Merino, MD Authorized by: Valarie Merino, MD   Consent:    Consent obtained:  Verbal and written   Consent given by:  Patient   Risks discussed:  Cutaneous burn, death, induced arrhythmia and pain   Alternatives discussed:  No treatment Pre-procedure details:    Cardioversion basis:  Emergent   Rhythm:  Atrial fibrillation   Electrode placement:  Anterior-posterior Patient sedated: Yes. Refer to sedation procedure documentation for details of sedation.  Attempt one:    Cardioversion mode:  Synchronous   Waveform:  Biphasic   Shock (Joules):  200  Shock outcome:  Conversion to normal sinus rhythm Post-procedure details:    Patient status:  Awake   Patient tolerance of procedure:  Tolerated well, no immediate complications .Sedation  Date/Time: 01/04/2023 12:23 PM  Performed by: Valarie Merino, MD Authorized by: Valarie Merino, MD   Consent:    Consent obtained:  Written and verbal   Consent given by:  Patient   Risks discussed:  Allergic reaction, dysrhythmia, inadequate sedation, nausea, vomiting, prolonged hypoxia resulting in organ damage, prolonged sedation necessitating reversal and respiratory compromise necessitating ventilatory assistance and intubation   Alternatives discussed:  Analgesia without sedation Universal protocol:    Immediately prior to procedure, a time out was called: yes     Patient identity confirmed:  Arm band and verbally with patient Indications:    Procedure performed:  Cardioversion   Procedure necessitating sedation performed by:  Physician performing sedation Pre-sedation assessment:    Time since last food or drink:  4   ASA classification: class 2 - patient with mild systemic disease     Mouth opening:  3 or more finger widths   Thyromental distance:  4 finger widths   Mallampati score:  I - soft palate, uvula, fauces, pillars visible   Neck  mobility: normal     Pre-sedation assessments completed and reviewed: airway patency, cardiovascular function, hydration status, mental status, nausea/vomiting, pain level, respiratory function and temperature   Immediate pre-procedure details:    Reassessment: Patient reassessed immediately prior to procedure     Reviewed: vital signs, relevant labs/tests and NPO status     Verified: bag valve mask available, emergency equipment available, intubation equipment available, IV patency confirmed, oxygen available and suction available   Procedure details (see MAR for exact dosages):    Preoxygenation:  Nasal cannula   Sedation:  Propofol   Intended level of sedation: deep   Intra-procedure monitoring:  Continuous capnometry, cardiac monitor, blood pressure monitoring, continuous pulse oximetry, frequent LOC assessments and frequent vital sign checks   Intra-procedure events: none     Total Provider sedation time (minutes):  30 Post-procedure details:    Attendance: Constant attendance by certified staff until patient recovered     Recovery: Patient returned to pre-procedure baseline     Post-sedation assessments completed and reviewed: airway patency, cardiovascular function, hydration status, mental status, nausea/vomiting, pain level, respiratory function and temperature     Patient is stable for discharge or admission: yes     Procedure completion:  Tolerated well, no immediate complications     Medications Ordered in ED Medications  diltiazem (CARDIZEM) 125 mg in dextrose 5% 125 mL (1 mg/mL) infusion (15 mg/hr Intravenous Infusion Verify 01/04/23 1001)  propofol (DIPRIVAN) 10 mg/mL bolus/IV push 41.3 mg (has no administration in time range)    ED Course/ Medical Decision Making/ A&P                             Medical Decision Making Amount and/or Complexity of Data Reviewed Labs: ordered. Radiology: ordered.  Risk Prescription drug management.    Medical Screen  Complete  This patient presented to the ED with complaint of A-fib with RVR.  This complaint involves an extensive number of treatment options. The initial differential diagnosis includes, but is not limited to, A-fib with RVR  This presentation is: Acute, Chronic, Self-Limited, Previously Undiagnosed, Uncertain Prognosis, Complicated, Systemic Symptoms, and Threat to Life/Bodily Function  Patient with known history of A-fib currently on  Eliquis and compliant fully with same presents from A-fib clinic for likely cardioversion.  Patient with initial heart rates into the 180s and 190s.  Cardizem drip started with control of heart rate into the 130s.  Patient is agreeable with ED cardioversion.  Patient is fully compliant with Eliquis.  Cardioversion and sedation performed without difficulty.  Patient tolerated this well.  Patient converted to normal sinus rhythm after 1 200 J synchronized shock.  Patient is known to cardiology and will follow-up with her established cardiologist in the outpatient setting.  Cardiology does not feel the patient requires additional rate control medications given that her A-fib may have been initially precipitated by hyperthyroid state.  Importance of close follow-up is stressed.  Strict return precautions given and understood.  Additional history obtained:   External records from outside sources obtained and reviewed including prior ED visits and prior Inpatient records.    Lab Tests:  I ordered and personally interpreted labs.  The pertinent results include: CBC, BMP, TSH   Imaging Studies ordered:  I ordered imaging studies including chest x-ray  I independently visualized and interpreted obtained imaging which showed NAD I agree with the radiologist interpretation.   Cardiac Monitoring:  The patient was maintained on a cardiac monitor.  I personally viewed and interpreted the cardiac monitor which showed an underlying rhythm of: A-fib with RVR  and then NSR   Medicines ordered:  I ordered medication including Cardizem drip, sedation for A-fib with RVR Reevaluation of the patient after these medicines showed that the patient: improved   Problem List / ED Course:  A-fib with RVR   Reevaluation:  After the interventions noted above, I reevaluated the patient and found that they have: improved   Disposition:  After consideration of the diagnostic results and the patients response to treatment, I feel that the patent would benefit from close outpatient follow-up.  CRITICAL CARE Performed by: Valarie Merino   Total critical care time: 30 minutes  Critical care time was exclusive of separately billable procedures and treating other patients.  Critical care was necessary to treat or prevent imminent or life-threatening deterioration.  Critical care was time spent personally by me on the following activities: development of treatment plan with patient and/or surrogate as well as nursing, discussions with consultants, evaluation of patient's response to treatment, examination of patient, obtaining history from patient or surrogate, ordering and performing treatments and interventions, ordering and review of laboratory studies, ordering and review of radiographic studies, pulse oximetry and re-evaluation of patient's condition.           Final Clinical Impression(s) / ED Diagnoses Final diagnoses:  Atrial fibrillation with RVR [facility])    Rx / DC Orders ED Discharge Orders     None         Valarie Merino, MD 01/04/23 1546

## 2023-01-04 NOTE — Discharge Instructions (Signed)
Return for any problem.   Follow-up closely with cardiology as instructed.

## 2023-01-04 NOTE — Progress Notes (Signed)
Primary Care Physician: Patient, No Pcp Per Primary Cardiologist: none Primary Electrophysiologist: none Referring Physician: [name] is a 77 y.o. female with a history of OSA, basal cell carcinoma, hyperthyroidism, atrial fibrillation who presents for follow up in the [facility]. Patient presented to [facility] on 09/28/22 with symptoms of palpitations and shortness of breath and was found to be in afib (unclear if RVR or not). Patient was diagnosed with hyperthyroidism in 04/2022. Initial management was with homeopathic regimen but by October, symptoms were worsening. Prescription therapy initiated with propranolol and PTU. Unclear if atrial fibrillation was noted on any physical exam. At outside hospital, CTA chest found multiple emboli in segmental arteries of right lower lobe. Patient is on Eliquis for a CHADS2VASC score of 3. She was discharged on propranolol and diltiazem for rate control. She called the clinic on 1/31 and 2/5 with symptoms of facial swelling and "ear pressure" about one hour after taking her medications. Diltiazem was discontinued first with minimal improvement, then propranolol was also discontinued.   On follow up today, patient reports that she feels "terrible" with fatigue and SOB. Her heart rate is 170 bpm today. Per patient report, her TSH and T4 have normalized. No bleeding issues on anticoagulation.   Today, she denies symptoms of chest pain, orthopnea, PND, lower extremity edema, dizziness, presyncope, syncope, bleeding, or neurologic sequela. The patient is tolerating medications without difficulties and is otherwise without complaint today.    Atrial Fibrillation Risk Factors:  she does have symptoms or diagnosis of sleep apnea. she is compliant with CPAP therapy. she does not have a history of rheumatic fever.   she has a BMI of Body mass index is 30.32 kg/m.Marland Kitchen Filed Weights   01/04/23 0828   Weight: 82.6 kg    Family History  Problem Relation Age of Onset   Heart attack Mother    Aneurysm Mother    Colon cancer Father      Atrial Fibrillation Management history:  Previous antiarrhythmic drugs: none Previous cardioversions: none Previous ablations: none CHADS2VASC score: 3 Anticoagulation history: Eliquis   Past Medical History:  Diagnosis Date   Allergy    Cancer ([place])    basil cell ca skin   History of shingles    eyes   Past Surgical History:  Procedure Laterality Date   APPENDECTOMY     DILATION AND CURETTAGE OF UTERUS     KNEE ARTHROSCOPY     left   MOHS SURGERY     face and leg   TONSILLECTOMY     age 49 and 62    Current Outpatient Medications  Medication Sig Dispense Refill   acetaminophen (TYLENOL) 500 MG tablet Take 1,000 mg by mouth daily as needed for moderate pain.     apixaban (ELIQUIS) 5 MG TABS tablet Take 1 tablet (5 mg total) by mouth 2 (two) times daily. 180 tablet 1   Bioflavonoid Products (QUERCETIN COMPLEX IMMUNE PO) Take 1 capsule by mouth in the morning and at bedtime.     Cholecalciferol (VITAMIN D3 PO) Take 1 capsule by mouth at bedtime.     ELDERBERRY PO Take by mouth. Taking 1 gummy by mouth daily     fluticasone (FLONASE) 50 MCG/ACT nasal spray Place 1 spray into both nostrils at bedtime.     KRILL OIL PO Take 1 capsule by mouth in the morning and at bedtime.     MAGNESIUM PO Take  1 tablet by mouth at bedtime.     methimazole (TAPAZOLE) 10 MG tablet Take 2 tablets (20 mg total) by mouth daily. 30 tablet 1   Multiple Vitamin (MULTIVITAMIN) tablet Taking one capsule by mouth daily Doterra - TerraZyme Digestive Enzyme Complex     Multiple Vitamins-Minerals (PRESERVISION AREDS 2 PO) Take 1 capsule by mouth every morning.     NASAL [facility] NA Place 1 Application into the nose daily.     OVER THE COUNTER MEDICATION Take 1 capsule by mouth in the morning and at bedtime. Fruits OTC vitamin     OVER THE COUNTER MEDICATION Take 1  capsule by mouth in the morning and at bedtime. Vegetables OTC vitamin     OVER THE COUNTER MEDICATION Take 1 capsule by mouth at bedtime. Medication: Serenity     Probiotic Product (PROBIOTIC DAILY PO) Take 1 capsule by mouth daily.     Thiamine HCl (THIAMINE PO) Take 1 capsule by mouth daily.     Turmeric (QC TUMERIC COMPLEX) 500 MG CAPS Take 1 capsule by mouth at bedtime.     VITAMIN K PO Take 1 tablet by mouth at bedtime.     EPINEPHrine (EPIPEN 2-PAK) 0.3 mg/0.3 mL IJ SOAJ injection Inject 0.3 mLs (0.3 mg total) into the muscle as needed (life-threatening allergic reaction). (Patient not taking: Reported on 01/04/2023) 2 Device 2   No current facility-administered medications for this encounter.    Allergies  Allergen Reactions   Latex Hives and Itching   Adhesive [Tape] Other (See Comments)    Welts   Avocado Other (See Comments)    Due to latex allergy    Bacitracin-Neomycin-Polymyxin Hives, Itching and Other (See Comments)    Turned skin purple    Banana Other (See Comments)    Due to latex allergy    Egg Yolk Other (See Comments)    Abdominal pain -does not take flu shot due to egg yolk allergy   Glucagon Nausea Only   Kiwi Extract Other (See Comments)    Due to latex allergy    [place] Wort Other (See Comments)    abd cramping   Diazepam Nausea And Vomiting   Meperidine Nausea Only    Social History   Socioeconomic History   Marital status: Married    Spouse name: Not on file   Number of children: Not on file   Years of education: Not on file   Highest education level: Not on file  Occupational History   Not on file  Tobacco Use   Smoking status: Former    Types: Cigarettes    Quit date: 11/27/1968    Years since quitting: 54.1   Smokeless tobacco: Never   Tobacco comments:    Former smoker 01/04/23  Substance and Sexual Activity   Alcohol use: Yes    Alcohol/week: 1.0 standard drink of alcohol    Types: 1 Standard drinks or equivalent per week     Comment: occ glass of wine 01/04/23   Drug use: No   Sexual activity: Not on file  Other Topics Concern   Not on file  Social History Narrative   Not on file   Social Determinants of Health   Financial Resource Strain: Not on file  Food Insecurity: Not on file  Transportation Needs: Not on file  Physical Activity: Not on file  Stress: Not on file  Social Connections: Not on file  Intimate Partner Violence: Not on file     ROS- All systems  are reviewed and negative except as per the HPI above.  Physical Exam: Vitals:   01/04/23 0828  BP: (!) 148/110  Pulse: (!) 171  Weight: 82.6 kg  Height: '5\' 5"'$  (1.651 m)     GEN- The patient is a well appearing elderly female, alert and oriented x 3 today.   HEENT-head normocephalic, atraumatic, sclera clear, conjunctiva pink, hearing intact, trachea midline. Lungs- Clear to ausculation bilaterally, normal work of breathing Heart- irregular rate and rhythm, tachycardia, no murmurs, rubs or gallops  GI- soft, NT, ND, + BS Extremities- no clubbing, cyanosis, or edema MS- no significant deformity or atrophy Skin- no rash or lesion Psych- euthymic mood, full affect Neuro- strength and sensation are intact   Wt Readings from Last 3 Encounters:  01/04/23 82.6 kg  10/16/22 75.8 kg  10/04/22 76.7 kg    EKG today demonstrates  Afib with RVR Vent. rate 171 BPM PR interval * ms QRS duration 92 ms QT/QTcB 262/441 ms  [place] 10/06/22 demonstrated   1. NO apical thrombus with Definity contrast. Left ventricular ejection  fraction, by estimation, is 45 to 50%. The left ventricle has mildly  decreased function. The left ventricle demonstrates global hypokinesis.  The left ventricular internal cavity size was mildly dilated. Left ventricular diastolic function could not be evaluated.   2. Right ventricular systolic function is low normal. The right  ventricular size is normal.   3. Left atrial size was severely dilated.   4. Right  atrial size was mildly dilated.   5. The mitral valve is grossly normal. Trivial mitral valve  regurgitation.   6. The aortic valve is tricuspid. Aortic valve regurgitation is trivial.   Comparison(s): No prior Echocardiogram.   Conclusion(s)/Recommendation(s): Consider LAD territory ischemia vs.  possible rate-related cardiomyopathy secondary to afib.   Epic records are reviewed at length today  CHA2DS2-VASc Score = 3  The patient's score is based upon: CHF History: 0 HTN History: 0 Diabetes History: 0 Stroke History: 0 Vascular Disease History: 0 Age Score: 2 Gender Score: 1       ASSESSMENT AND PLAN: 1. Persistent Atrial Fibrillation (ICD10:  I48.19) The patient's CHA2DS2-VASc score is 3, indicating a 3.2% annual risk of stroke.   Patient in rapid afib today. She did not tolerate oral BB or CCB. We discussed trying an alternate BB and arranging an outpatient DCCV. She does not feel she can wait that long, I agree. Will send her to the ED for urgent DCCV. Hopefully, she will maintain SR post DCCV now that her thyroid function has normalized and she will not need long term medication.  Continue Eliquis 5 mg BID  2. Secondary Hypercoagulable State (ICD10:  D68.69) The patient is at significant risk for stroke/thromboembolism based upon her CHA2DS2-VASc Score of 3.  Continue Apixaban (Eliquis).   3. Hyperthyroidism  On methimazole Followed by endocrinology.  4. Obstructive sleep apnea Encouraged compliance with CPAP therapy.   Patient sent to ED for urgent DCCV. ED charge nurse called. Long term, she would prefer to follow up with cardiology locally, will get her in with Dr Margaretann Loveless.    [facility] [address] [place], [place] [address] [phone] 01/04/2023 8:45 AM

## 2023-01-04 NOTE — ED Triage Notes (Signed)
Pt here from the afib clinic with afib rvr. Recently hospitalized for thyroid storm. Pt endorses head/ear pressure, vivid dreams, and swelling to extremities.

## 2023-01-05 ENCOUNTER — Ambulatory Visit: Payer: Medicare Other | Admitting: Cardiology

## 2023-01-31 ENCOUNTER — Ambulatory Visit: Payer: Medicare Other | Attending: Nurse Practitioner | Admitting: Nurse Practitioner

## 2023-01-31 ENCOUNTER — Other Ambulatory Visit: Payer: Self-pay

## 2023-01-31 ENCOUNTER — Encounter: Payer: Self-pay | Admitting: Nurse Practitioner

## 2023-01-31 VITALS — BP 190/110 | HR 88 | Ht 65.0 in | Wt 179.0 lb

## 2023-01-31 DIAGNOSIS — G4733 Obstructive sleep apnea (adult) (pediatric): Secondary | ICD-10-CM | POA: Diagnosis not present

## 2023-01-31 DIAGNOSIS — I4819 Other persistent atrial fibrillation: Secondary | ICD-10-CM | POA: Insufficient documentation

## 2023-01-31 DIAGNOSIS — I429 Cardiomyopathy, unspecified: Secondary | ICD-10-CM | POA: Diagnosis not present

## 2023-01-31 DIAGNOSIS — E0590 Thyrotoxicosis, unspecified without thyrotoxic crisis or storm: Secondary | ICD-10-CM | POA: Diagnosis not present

## 2023-01-31 MED ORDER — OLMESARTAN MEDOXOMIL 20 MG PO TABS: 20.0000 mg | ORAL_TABLET | Freq: Every day | ORAL | 3 refills | Status: AC

## 2023-01-31 NOTE — Telephone Encounter (Signed)
Spoke with pt. Olmesartan 20 mg daily was sent to [facility]. Spoke with pt and she is aware that RX was sent in.

## 2023-01-31 NOTE — Patient Instructions (Addendum)
Medication Instructions:  Your physician recommends that you continue on your current medications as directed. Please refer to the Current Medication list given to you today.  *If you need a refill on your cardiac medications before your next appointment, please call your pharmacy*   Lab Work: NONE ordered at this time of appointment   If you have labs (blood work) drawn today and your tests are completely normal, you will receive your results only by: [place] (if you have MyChart) OR A paper copy in the mail If you have any lab test that is abnormal or we need to change your treatment, we will call you to review the results.   Testing/Procedures: Your physician has requested that you have an echocardiogram. Echocardiography is a painless test that uses sound waves to create images of your heart. It provides your doctor with information about the size and shape of your heart and how well your heart's chambers and valves are working. This procedure takes approximately one hour. There are no restrictions for this procedure. Please do NOT wear cologne, perfume, aftershave, or lotions (deodorant is allowed). Please arrive 15 minutes prior to your appointment time.     Follow-Up: At [facility], you and your health needs are our priority.  As part of our continuing mission to provide you with exceptional heart care, we have created designated Provider Care Teams.  These Care Teams include your primary Cardiologist (physician) and Advanced Practice Providers (APPs -  Physician Assistants and Nurse Practitioners) who all work together to provide you with the care you need, when you need it.  We recommend signing up for the patient portal called "MyChart".  Sign up information is provided on this After Visit Summary.  MyChart is used to connect with patients for Virtual Visits (Telemedicine).  Patients are able to view lab/test results, encounter notes, upcoming appointments, etc.   Non-urgent messages can be sent to your provider as well.   To learn more about what you can do with MyChart, go to [email or web address].    Your next appointment:    March 21, 2022  Provider:   Elouise Munroe, MD     Other Instructions Monitor Blood pressure. Report BP consistently greater than 140/90

## 2023-01-31 NOTE — Progress Notes (Signed)
Office Visit    Patient Name: Marissa Parker Date of Encounter: 01/31/2023  Primary Care Provider:  Patient, No Pcp Per Primary Cardiologist:  Elouise Munroe, MD  Chief Complaint    77 year old female with a history of persistent atrial fibrillation, hyperthyroidism, basal cell carcinoma, and OSA who presents for hospital follow-up related to atrial fibrillation.  Past Medical History    Past Medical History:  Diagnosis Date   Allergy    Cancer ([place])    basil cell ca skin   History of shingles    eyes   Past Surgical History:  Procedure Laterality Date   APPENDECTOMY     DILATION AND CURETTAGE OF UTERUS     KNEE ARTHROSCOPY     left   MOHS SURGERY     face and leg   TONSILLECTOMY     age 28 and 8    Allergies  Allergies  Allergen Reactions   Latex Hives and Itching   Adhesive [Tape] Other (See Comments)    Welts   Avocado Other (See Comments)    Due to latex allergy    Bacitracin-Neomycin-Polymyxin Hives, Itching and Other (See Comments)    Turned skin purple    Banana Other (See Comments)    Due to latex allergy    Egg Yolk Other (See Comments)    Abdominal pain -does not take flu shot due to egg yolk allergy   Glucagon Nausea Only   Kiwi Extract Other (See Comments)    Due to latex allergy    [place] Wort Other (See Comments)    abd cramping   Diazepam Nausea And Vomiting   Meperidine Nausea Only     Labs/Other Studies Reviewed    The following studies were reviewed today: Echo 09/2022: IMPRESSIONS     1. NO apical thrombus with Definity contrast. Left ventricular ejection  fraction, by estimation, is 45 to 50%. The left ventricle has mildly  decreased function. The left ventricle demonstrates global hypokinesis.  The left ventricular internal cavity  size was mildly dilated. Left ventricular diastolic function could not be  evaluated.   2. Right ventricular systolic function is low normal. The right  ventricular size is normal.   3. Left  atrial size was severely dilated.   4. Right atrial size was mildly dilated.   5. The mitral valve is grossly normal. Trivial mitral valve  regurgitation.   6. The aortic valve is tricuspid. Aortic valve regurgitation is trivial.   Comparison(s): No prior Echocardiogram.   Conclusion(s)/Recommendation(s): Consider LAD territory ischemia vs.  possible rate-related cardiomyopathy secondary to afib.   Recent Labs: 10/04/2022: ALT 40; B Natriuretic Peptide 853.8 10/05/2022: Magnesium 2.1 01/04/2023: BUN 16; Creatinine, Ser 0.86; Hemoglobin 15.7; Platelets 161; Potassium 3.9; Sodium 138; TSH 4.046  Recent Lipid Panel No results found for: "CHOL", "TRIG", "HDL", "CHOLHDL", "VLDL", "LDLCALC", "LDLDIRECT"  History of Present Illness    77 year old female with the above past medical history including persistent atrial fibrillation, hyperthyroidism, basal cell carcinoma, and OSA.  She was diagnosed with hyperthyroidism in 04/2022.  She transferred care to [facility] to [facility] in 09/2022 with concern for thyroid storm, atrial fibrillation with RVR, PE.  Lower extremity Dopplers were negative for DVT.  Echocardiogram showed EF  45 to 50%, mildly decreased LV function LV global hypokinesis, indeterminate cell parameters, normal RV systolic function, severely dilated left atrium, mildly dilated right atrium, no significant valvular disease.  She was discharged in rate-controlled atrial fibrillation  on po diltiazem, propranolol, and Eliquis.  She was referred to the A-fib clinic.  Diltiazem and propranolol were later discontinued due to concern for facial swelling and "ear pressure."  She was last seen in the A-fib clinic on 01/04/2023 and was noted to be in atrial fibrillation with RVR.  She was symptomatic with HR 170 bpm.  She was sent to the ED and underwent emergent DCCV's with restoration of normal sinus rhythm.  She was discharged home in stable condition.  She presents today for  follow-up accompanied by her husband and daughter. Since her last visit she has been stable overall from a cardiac standpoint.  She is frustrated and anxious as she was told she would be seeing Dr. Margaretann Loveless or Dr. [place] for follow-up and not an APP.  She was also told that she had an appointment scheduled with Dr. Margaretann Loveless on 03/20/2023, but there is no evidence of this.  [place] to get here today.  She saw her endocrinologist yesterday and had repeat lab work.  Her T4 was "a little low."  Over the past 2 days she has noted elevated BP.  She is asymptomatic.  She denies any palpitations, dyspnea.  Other than her concern for her elevated blood pressure, she reports feeling well.  Home Medications    Current Outpatient Medications  Medication Sig Dispense Refill   acetaminophen (TYLENOL) 500 MG tablet Take 1,000 mg by mouth daily as needed for moderate pain.     apixaban (ELIQUIS) 5 MG TABS tablet Take 1 tablet (5 mg total) by mouth 2 (two) times daily. 180 tablet 1   Bioflavonoid Products (QUERCETIN COMPLEX IMMUNE PO) Take 1 capsule by mouth in the morning and at bedtime.     Cholecalciferol (VITAMIN D3 PO) Take 1 capsule by mouth at bedtime.     ELDERBERRY PO Take by mouth. Taking 1 gummy by mouth daily     EPINEPHrine (EPIPEN 2-PAK) 0.3 mg/0.3 mL IJ SOAJ injection Inject 0.3 mLs (0.3 mg total) into the muscle as needed (life-threatening allergic reaction). 2 Device 2   fluticasone (FLONASE) 50 MCG/ACT nasal spray Place 1 spray into both nostrils at bedtime.     KRILL OIL PO Take 1 capsule by mouth in the morning and at bedtime.     MAGNESIUM PO Take 1 tablet by mouth at bedtime.     methimazole (TAPAZOLE) 10 MG tablet Take 2 tablets (20 mg total) by mouth daily. 30 tablet 1   Multiple Vitamin (MULTIVITAMIN) tablet Taking one capsule by mouth daily Doterra - TerraZyme Digestive Enzyme Complex     Multiple Vitamins-Minerals (PRESERVISION AREDS 2 PO)  Take 1 capsule by mouth every morning.     NASAL [place] NA Place 1 Application into the nose daily.     OVER THE COUNTER MEDICATION Take 1 capsule by mouth in the morning and at bedtime. Fruits OTC vitamin     OVER THE COUNTER MEDICATION Take 1 capsule by mouth in the morning and at bedtime. Vegetables OTC vitamin     OVER THE COUNTER MEDICATION Take 1 capsule by mouth at bedtime. Medication: Serenity     Probiotic Product (PROBIOTIC DAILY PO) Take 1 capsule by mouth daily.     Thiamine HCl (THIAMINE PO) Take 1 capsule by mouth daily.     Turmeric (QC TUMERIC COMPLEX) 500 MG CAPS Take 1 capsule by mouth at bedtime.     VITAMIN K PO Take 1 tablet by  mouth at bedtime.     No current facility-administered medications for this visit.     Review of Systems    She denies chest pain, palpitations, dyspnea, pnd, orthopnea, n, v, dizziness, syncope, edema, weight gain, or early satiety. All other systems reviewed and are otherwise negative except as noted above.   Physical Exam    VS:  BP (!) 190/110   Pulse 88   Ht '5\' 5"'$  (1.651 m)   Wt 179 lb (81.2 kg)   BMI 29.79 kg/m   GEN: Well nourished, well developed, in no acute distress. HEENT: normal. Neck: Supple, no JVD, carotid bruits, or masses. Cardiac: RRR, no murmurs, rubs, or gallops. No clubbing, cyanosis, edema.  Radials/DP/PT 2+ and equal bilaterally.  Respiratory:  Respirations regular and unlabored, clear to auscultation bilaterally. GI: Soft, nontender, nondistended, BS + x 4. MS: no deformity or atrophy. Skin: warm and dry, no rash. Neuro:  Strength and sensation are intact. Psych: Normal affect.  Accessory Clinical Findings    ECG personally reviewed by me today -NSR, 88 bpm- no acute changes.   Lab Results  Component Value Date   WBC 5.1 01/04/2023   HGB 15.7 (H) 01/04/2023   HCT 46.3 (H) 01/04/2023   MCV 101.5 (H) 01/04/2023   PLT 161 01/04/2023   Lab Results  Component Value Date   CREATININE 0.86 01/04/2023   BUN  16 01/04/2023   NA 138 01/04/2023   K 3.9 01/04/2023   CL 104 01/04/2023   CO2 22 01/04/2023   Lab Results  Component Value Date   ALT 40 10/04/2022   AST 43 (H) 10/04/2022   ALKPHOS 85 10/04/2022   BILITOT 0.7 10/04/2022   No results found for: "CHOL", "HDL", "LDLCALC", "LDLDIRECT", "TRIG", "CHOLHDL"  No results found for: "HGBA1C"  Assessment & Plan    1. Persistent atrial fibrillation: S/p DCCV on 01/04/2023.  Maintaining sinus rhythm.  It was felt that her hyperthyroidism likely prompted atrial fibrillation. She did not tolerate diltiazem or propranolol.  Discussed ED precautions. Continue Eliquis.    2. Cardiomyopathy: Echo in 09/2022 showed EF  45 to 50%, mildly decreased LV function LV global hypokinesis, indeterminate cell parameters, normal RV systolic function, severely dilated left atrium, mildly dilated right atrium, no significant valvular disease.  Likely tachycardia mediated.  Euvolemic and well compensated on exam. Now that she is in sinus rhythm, will repeat echocardiogram.    3. Hyperthyroidism: Following with endocrinology.  On Tapazole.  4. Elevated BP: BP is elevated in office today. BP has been elevated the past 2 days. Recent thyroid levels were stable per patient.  We discussed the potential harm of ongoing elevated BP, including risk of stroke.  Discussed medication options.  Patient declines medication at this time.  I advised her to continue to monitor BP and report BP consistently greater than 140/90.    5. OSA: Adherent to CPAP.  6. Disposition: Follow-up in 02/2023 with Dr. Margaretann Loveless.   HYPERTENSION CONTROL Vitals:   01/31/23 0956 01/31/23 1006 01/31/23 1100  BP: (!) 214/118 (!) 210/110 (!) 190/110    The patient's blood pressure is elevated above target today.  In order to address the patient's elevated BP: Blood pressure will be monitored at home to determine if medication changes need to be made.; Follow up with general cardiology has been  recommended.     Lenna Sciara, NP 01/31/2023, 1:39 PM

## 2023-02-02 ENCOUNTER — Telehealth: Payer: Self-pay | Admitting: Nurse Practitioner

## 2023-02-02 DIAGNOSIS — I429 Cardiomyopathy, unspecified: Secondary | ICD-10-CM

## 2023-02-02 NOTE — Telephone Encounter (Signed)
Spoke with pt regarding lab work that pt was told to have done in 2 weeks after starting olmesartan. Pt instructed to have a BMET. Lab orders placed and mailed to pt's home address. She will take them to her PCP and have these done. Pt verbalizes understanding.

## 2023-02-02 NOTE — Telephone Encounter (Signed)
  Pt is calling she would like to confirm if [facility] sent her labs order to her pcp office Dr. Mertie Clause office phone# [phone]

## 2023-02-16 LAB — LAB REPORT - SCANNED: EGFR: 91

## 2023-03-21 ENCOUNTER — Ambulatory Visit (HOSPITAL_COMMUNITY): Payer: Medicare Other | Attending: Nurse Practitioner

## 2023-03-21 DIAGNOSIS — I4819 Other persistent atrial fibrillation: Secondary | ICD-10-CM | POA: Insufficient documentation

## 2023-03-21 DIAGNOSIS — I429 Cardiomyopathy, unspecified: Secondary | ICD-10-CM | POA: Diagnosis present

## 2023-03-21 LAB — ECHOCARDIOGRAM COMPLETE
AR max vel: 1.64 cm2
AV Area VTI: 1.79 cm2
AV Area mean vel: 1.65 cm2
AV Mean grad: 10.5 mmHg
AV Peak grad: 22.8 mmHg
Ao pk vel: 2.39 m/s
Area-P 1/2: 3.42 cm2
S' Lateral: 3.6 cm

## 2023-03-21 NOTE — Progress Notes (Signed)
Cardiology Office Note:    Date:  03/21/2023  ID:  Marissa Parker, DOB June 10, 1946, MRN [id number]  PCP:  Patient, No Pcp Per  Cardiologist:  Parke Poisson, MD  Electrophysiologist:  None   Referring MD: No ref. provider found   Chief Complaint/Reason for Referral: ***  History of Present Illness:    Marissa Parker is a 77 y.o. female with a history of persistent Afib, hypothyroidism, OSA, and basal cell carcinoma. I initially met her in the hospital in 09/2022 for dyspnea and palpitations.    Today,    *** denies any palpitations, chest pain, shortness of breath, or peripheral edema. No lightheadedness, headaches, syncope, orthopnea, or PND.  (+)   Past Medical History:  Diagnosis Date   Allergy    Cancer (H[facility])    basil cell ca skin   History of shingles    eyes    Past Surgical History:  Procedure Laterality Date   APPENDECTOMY     DILATION AND CURETTAGE OF UTERUS     KNEE ARTHROSCOPY     left   MOHS SURGERY     face and leg   TONSILLECTOMY     age 53 and 58    Current Medications: No outpatient medications have been marked as taking for the 03/22/23 encounter (Appointment) with Parke Poisson, MD.     Allergies:   Latex, Adhesive [tape], Avocado, Bacitracin-neomycin-polymyxin, Banana, Egg yolk, Glucagon, Kiwi extract, St johns wort, Diazepam, and Meperidine   Social History   Tobacco Use   Smoking status: Former    Types: Cigarettes    Quit date: 11/27/1968    Years since quitting: 54.3   Smokeless tobacco: Never   Tobacco comments:    Former smoker 01/04/23  Substance Use Topics   Alcohol use: Yes    Alcohol/week: 1.0 standard drink of alcohol    Types: 1 Standard drinks or equivalent per week    Comment: occ glass of wine 01/04/23   Drug use: No     Family History: The patient's family history includes Aneurysm in her mother; Colon cancer in her father; Heart attack in her mother.  ROS:   Please see the history of present illness.     All  other systems reviewed and are negative.  EKGs/Labs/Other Studies Reviewed:    The following studies were reviewed today:  Echo 10/06/2022: IMPRESSIONS   1. NO apical thrombus with Definity contrast. Left ventricular ejection  fraction, by estimation, is 45 to 50%. The left ventricle has mildly  decreased function. The left ventricle demonstrates global hypokinesis.  The left ventricular internal cavity  size was mildly dilated. Left ventricular diastolic function could not be  evaluated.   2. Right ventricular systolic function is low normal. The right  ventricular size is normal.   3. Left atrial size was severely dilated.   4. Right atrial size was mildly dilated.   5. The mitral valve is grossly normal. Trivial mitral valve  regurgitation.   6. The aortic valve is tricuspid. Aortic valve regurgitation is trivial.    Lower Venous DVT Study 10/05/2022: Summary:  BILATERAL:  - No evidence of deep vein thrombosis seen in the lower extremities,  bilaterally.  -No evidence of popliteal cyst, bilaterally.   EKG:  *** 03/22/2023: ***   Imaging studies that I have independently reviewed today: ***  Recent Labs: 10/04/2022: ALT 40; B Natriuretic Peptide 853.8 10/05/2022: Magnesium 2.1 01/04/2023: BUN 16; Creatinine, Ser 0.86; Hemoglobin 15.7; Platelets 161; Potassium  3.9; Sodium 138; TSH 4.046  Recent Lipid Panel No results found for: "CHOL", "TRIG", "HDL", "CHOLHDL", "VLDL", "LDLCALC", "LDLDIRECT"  Physical Exam:    VS:  There were no vitals taken for this visit.    Wt Readings from Last 5 Encounters:  01/31/23 179 lb (81.2 kg)  01/04/23 182 lb 3.2 oz (82.6 kg)  10/16/22 167 lb (75.8 kg)  10/04/22 169 lb (76.7 kg)  02/24/16 162 lb (73.5 kg)    Constitutional: No acute distress Eyes: sclera non-icteric, normal conjunctiva and lids ENMT: normal dentition, moist mucous membranes Cardiovascular: regular rhythm, normal rate, no murmur. S1 and S2 normal. No jugular venous  distention.  Respiratory: clear to auscultation bilaterally GI : normal bowel sounds, soft and nontender. No distention.   MSK: extremities warm, well perfused. No edema.  NEURO: grossly nonfocal exam, moves all extremities. PSYCH: alert and oriented x 3, normal mood and affect.   ASSESSMENT:    No diagnosis found. PLAN:    No diagnosis found.  Total time of encounter: *** minutes total time of encounter, including *** minutes spent in face-to-face patient care on the date of this encounter. This time includes coordination of care and counseling regarding above mentioned problem list. Remainder of non-face-to-face time involved reviewing chart documents/testing relevant to the patient encounter and documentation in the medical record. I have independently reviewed documentation from referring provider.   Weston Brass, MD, [facility]  [facility]   Shared Decision Making/Informed Consent:   {Are you ordering a CV Procedure (e.g. stress test, cath, DCCV, TEE, etc)?   Press F2        :[id number]}   Medication Adjustments/Labs and Tests Ordered: Current medicines are reviewed at length with the patient today.  Concerns regarding medicines are outlined above.   No orders of the defined types were placed in this encounter.  No orders of the defined types were placed in this encounter.  There are no Patient Instructions on file for this visit.   I,Rachel Rivera,acting as a scribe for Parke Poisson, MD.,have documented all relevant documentation on the behalf of Parke Poisson, MD,as directed by  Parke Poisson, MD while in the presence of Parke Poisson, MD.  ***

## 2023-03-22 ENCOUNTER — Ambulatory Visit: Payer: Medicare Other | Attending: Internal Medicine | Admitting: Internal Medicine

## 2023-03-22 ENCOUNTER — Encounter: Payer: Self-pay | Admitting: Internal Medicine

## 2023-03-22 VITALS — BP 146/81 | HR 106 | Ht 65.0 in | Wt 179.0 lb

## 2023-03-22 DIAGNOSIS — I4819 Other persistent atrial fibrillation: Secondary | ICD-10-CM | POA: Diagnosis not present

## 2023-03-22 DIAGNOSIS — D6869 Other thrombophilia: Secondary | ICD-10-CM

## 2023-03-22 DIAGNOSIS — I429 Cardiomyopathy, unspecified: Secondary | ICD-10-CM | POA: Diagnosis not present

## 2023-03-22 DIAGNOSIS — G4733 Obstructive sleep apnea (adult) (pediatric): Secondary | ICD-10-CM

## 2023-03-22 DIAGNOSIS — E0590 Thyrotoxicosis, unspecified without thyrotoxic crisis or storm: Secondary | ICD-10-CM | POA: Insufficient documentation

## 2023-03-22 NOTE — Patient Instructions (Signed)
Medication Instructions:  No Changes In Medications at this time.  *If you need a refill on your cardiac medications before your next appointment, please call your pharmacy*  Lab Work: None Ordered At This Time.  If you have labs (blood work) drawn today and your tests are completely normal, you will receive your results only by: MyChart Message (if you have MyChart) OR A paper copy in the mail If you have any lab test that is abnormal or we need to change your treatment, we will call you to review the results.  Testing/Procedures: None Ordered At This Time.   Follow-Up: At [facility], you and your health needs are our priority.  As part of our continuing mission to provide you with exceptional heart care, we have created designated Provider Care Teams.  These Care Teams include your primary Cardiologist (physician) and Advanced Practice Providers (APPs -  Physician Assistants and Nurse Practitioners) who all work together to provide you with the care you need, when you need it.  Your next appointment:   12  month(s)  Provider:   Parke Poisson, MD     RECOMMEND  ELIQUIS TWICE DAILY FOR FULL STROKE PREVENTION

## 2024-04-09 NOTE — Progress Notes (Signed)
 History of Present Illness The patient is a 78 year old female who presents for evaluation of thyroid  problems.  She experienced a thyroid  storm in 09/2022 and has been under the care of her internist, Dr. Cammie, since then, with medical management of hyperthyroidism. Despite normal T4 and T3 levels, her antibodies remain elevated. She has been diagnosed with Graves' disease and is currently on medication, which is reported to be effective. However, she has been informed that the condition could potentially relapse. Her internist has advised against radioactive iodine  treatment, a sentiment she shares due to concerns about potential systemic effects. She is considering surgical intervention as a definitive solution.   She has also been diagnosed with isolated systolic hypertension, attributed to her hyperthyroidism, characterized by high systolic and low diastolic readings. This condition remains unregulated despite medication. She has been experiencing severe leg cramps and constipation, which she attributes to her blood pressure medication.  She has been using a CPAP machine for sleep apnea for approximately 5 years without issue. However, she has recently experienced dryness due to her blood pressure medication, causing her tongue to adhere to the roof of her mouth during sleep. This issue was identified by her dentist, who prescribed a cream for relief.  Physical Exam Neck: Thyroid  gland palpation reveals no abnormalities. The thyroid  gland is of normal size and there are no palpable nodules.  Assessment & Plan 1. Hyperthyroidism. Diagnosed with Graves' disease and currently stable on medication. Prefers surgical intervention over radioactive iodine  due to concerns about potential side effects. A comprehensive discussion was held regarding the potential risks and benefits associated with total thyroidectomy, including the necessity for lifelong thyroid  hormone replacement therapy post-surgery.  Risks discussed include potential hoarseness or damage to vocal cord nerves, temporary or rare permanent calcium level issues, bleeding, infection, and the possibility of inducing a thyroid  storm during surgery. She was advised to inform the anesthesia team about her previous adverse reactions to propranolol  and Cardizem . A request for clearance from her cardiologist will be sent prior to scheduling the surgery.  Results

## 2024-06-12 ENCOUNTER — Other Ambulatory Visit: Payer: Self-pay | Admitting: Otolaryngology

## 2024-06-30 NOTE — Pre-Procedure Instructions (Signed)
 Surgical Instructions   Your procedure is scheduled on July 02, 2024. Report to [facility] Main Entrance A at 8:00 A.M., then check in with the Admitting office. Any questions or running late day of surgery: call [phone]  Questions prior to your surgery date: call [phone], Monday-Friday, 8am-4pm. If you experience any cold or flu symptoms such as cough, fever, chills, shortness of breath, etc. between now and your scheduled surgery, please notify us  at the above number.     Remember:  Do not eat after midnight the night before your surgery   You may drink clear liquids until 7:00 AM the morning of your surgery.   Clear liquids allowed are: Water, Non-Citrus Juices (without pulp), Carbonated Beverages, Clear Tea (no milk, honey, etc.), Black Coffee Only (NO MILK, CREAM OR POWDERED CREAMER of any kind), and Gatorade.    Take these medicines the morning of surgery with A SIP OF WATER: cloNIDine  (CATAPRES )  fluticasone (FLONASE) nasal spray  levothyroxine  (SYNTHROID )  methimazole  (TAPAZOLE )    May take these medicines IF NEEDED: EPINEPHrine  Pen loratadine (CLARITIN)    Follow your surgeon's instructions on when to stop apixaban  (ELIQUIS ).  If no instructions were given by your surgeon then you will need to call the office to get those instructions.     One week prior to surgery, STOP taking any Aspirin (unless otherwise instructed by your surgeon) Aleve, Naproxen, Ibuprofen, Motrin, Advil, Goody's, BC's, all herbal medications, fish oil, and non-prescription vitamins.                     Do NOT Smoke (Tobacco/Vaping) for 24 hours prior to your procedure.  If you use a CPAP at night, you may bring your mask/headgear for your overnight stay.   You will be asked to remove any contacts, glasses, piercing's, hearing aid's, dentures/partials prior to surgery. Please bring cases for these items if needed.    Patients discharged the day of surgery will not be allowed to  drive home, and someone needs to stay with them for 24 hours.  SURGICAL WAITING ROOM VISITATION Patients may have no more than 2 support people in the waiting area - these visitors may rotate.   Pre-op nurse will coordinate an appropriate time for 1 ADULT support person, who may not rotate, to accompany patient in pre-op.  Children under the age of 56 must have an adult with them who is not the patient and must remain in the main waiting area with an adult.  If the patient needs to stay at the hospital during part of their recovery, the visitor guidelines for inpatient rooms apply.  Please refer to the [facility] website for the visitor guidelines for any additional information.   If you received a COVID test during your pre-op visit  it is requested that you wear a mask when out in public, stay away from anyone that may not be feeling well and notify your surgeon if you develop symptoms. If you have been in contact with anyone that has tested positive in the last 10 days please notify you surgeon.      Pre-operative CHG Bathing Instructions   You can play a key role in reducing the risk of infection after surgery. Your skin needs to be as free of germs as possible. You can reduce the number of germs on your skin by washing with CHG (chlorhexidine  gluconate) soap before surgery. CHG is an antiseptic soap that kills germs and continues to kill germs even  after washing.   DO NOT use if you have an allergy to chlorhexidine /CHG or antibacterial soaps. If your skin becomes reddened or irritated, stop using the CHG and notify one of our RNs at [phone].              TAKE A SHOWER THE NIGHT BEFORE SURGERY AND THE DAY OF SURGERY    Please keep in mind the following:  DO NOT shave, including legs and underarms, 48 hours prior to surgery.   You may shave your face before/day of surgery.  Place clean sheets on your bed the night before surgery Use a clean washcloth (not used since being  washed) for each shower. DO NOT sleep with pet's night before surgery.  CHG Shower Instructions:  Wash your face and private area with normal soap. If you choose to wash your hair, wash first with your normal shampoo.  After you use shampoo/soap, rinse your hair and body thoroughly to remove shampoo/soap residue.  Turn the water OFF and apply half the bottle of CHG soap to a CLEAN washcloth.  Apply CHG soap ONLY FROM YOUR NECK DOWN TO YOUR TOES (washing for 3-5 minutes)  DO NOT use CHG soap on face, private areas, open wounds, or sores.  Pay special attention to the area where your surgery is being performed.  If you are having back surgery, having someone wash your back for you may be helpful. Wait 2 minutes after CHG soap is applied, then you may rinse off the CHG soap.  Pat dry with a clean towel  Put on clean pajamas    Additional instructions for the day of surgery: DO NOT APPLY any lotions, deodorants, cologne, or perfumes.   Do not wear jewelry or makeup Do not wear nail polish, gel polish, artificial nails, or any other type of covering on natural nails (fingers and toes) Do not bring valuables to the hospital. [facility] is not responsible for valuables/personal belongings. Put on clean/comfortable clothes.  Please brush your teeth.  Ask your nurse before applying any prescription medications to the skin.

## 2024-07-01 ENCOUNTER — Encounter (HOSPITAL_COMMUNITY)
Admission: RE | Admit: 2024-07-01 | Discharge: 2024-07-01 | Disposition: A | Discharge status: Home / Self Care | Source: Ambulatory Visit | Attending: Otolaryngology | Admitting: Otolaryngology

## 2024-07-01 ENCOUNTER — Other Ambulatory Visit: Payer: Self-pay

## 2024-07-01 ENCOUNTER — Encounter (HOSPITAL_COMMUNITY): Payer: Self-pay

## 2024-07-01 VITALS — BP 173/82 | HR 69 | Temp 97.8°F | Resp 16 | Ht 61.5 in | Wt 184.0 lb

## 2024-07-01 DIAGNOSIS — I480 Paroxysmal atrial fibrillation: Secondary | ICD-10-CM | POA: Diagnosis not present

## 2024-07-01 DIAGNOSIS — H3530 Unspecified macular degeneration: Secondary | ICD-10-CM | POA: Insufficient documentation

## 2024-07-01 DIAGNOSIS — Z86711 Personal history of pulmonary embolism: Secondary | ICD-10-CM | POA: Insufficient documentation

## 2024-07-01 DIAGNOSIS — E0590 Thyrotoxicosis, unspecified without thyrotoxic crisis or storm: Secondary | ICD-10-CM | POA: Diagnosis not present

## 2024-07-01 DIAGNOSIS — E049 Nontoxic goiter, unspecified: Secondary | ICD-10-CM | POA: Diagnosis not present

## 2024-07-01 DIAGNOSIS — E0500 Thyrotoxicosis with diffuse goiter without thyrotoxic crisis or storm: Secondary | ICD-10-CM | POA: Insufficient documentation

## 2024-07-01 DIAGNOSIS — Z01812 Encounter for preprocedural laboratory examination: Secondary | ICD-10-CM | POA: Insufficient documentation

## 2024-07-01 DIAGNOSIS — G4733 Obstructive sleep apnea (adult) (pediatric): Secondary | ICD-10-CM | POA: Insufficient documentation

## 2024-07-01 DIAGNOSIS — I2510 Atherosclerotic heart disease of native coronary artery without angina pectoris: Secondary | ICD-10-CM | POA: Diagnosis not present

## 2024-07-01 HISTORY — DX: Other complications of anesthesia, initial encounter: T88.59XA

## 2024-07-01 HISTORY — DX: Thyrotoxicosis, unspecified without thyrotoxic crisis or storm: E05.90

## 2024-07-01 HISTORY — DX: Sleep apnea, unspecified: G47.30

## 2024-07-01 HISTORY — DX: Essential (primary) hypertension: I10

## 2024-07-01 HISTORY — DX: Headache, unspecified: R51.9

## 2024-07-01 HISTORY — DX: Cardiac arrhythmia, unspecified: I49.9

## 2024-07-01 HISTORY — DX: Unspecified osteoarthritis, unspecified site: M19.90

## 2024-07-01 HISTORY — DX: Unspecified macular degeneration: H35.30

## 2024-07-01 LAB — BASIC METABOLIC PANEL WITH GFR
Anion gap: 10 (ref 5–15)
BUN: 17 mg/dL (ref 8–23)
CO2: 25 mmol/L (ref 22–32)
Calcium: 9.5 mg/dL (ref 8.9–10.3)
Chloride: 105 mmol/L (ref 98–111)
Creatinine, Ser: 0.74 mg/dL (ref 0.44–1.00)
GFR, Estimated: >60 mL/min (ref >60)
Glucose, Bld: 99 mg/dL (ref 70–99)
Potassium: 4.2 mmol/L (ref 3.5–5.1)
Sodium: 140 mmol/L (ref 135–145)

## 2024-07-01 LAB — CBC
HCT: 45.4 % (ref 36.0–46.0)
Hemoglobin: 15.2 g/dL — ABNORMAL HIGH (ref 12.0–15.0)
MCH: 34.8 pg — ABNORMAL HIGH (ref 26.0–34.0)
MCHC: 33.5 g/dL (ref 30.0–36.0)
MCV: 103.9 fL — ABNORMAL HIGH (ref 80.0–100.0)
Platelets: 209 K/uL (ref 150–400)
RBC: 4.37 MIL/uL (ref 3.87–5.11)
RDW: 12.9 % (ref 11.5–15.5)
WBC: 6.2 K/uL (ref 4.0–10.5)
nRBC: 0 % (ref 0.0–0.2)

## 2024-07-01 NOTE — Progress Notes (Signed)
 PCP - Curly HILARIO Pacini, DO Cardiologist - Dr. Alm Beat with [facility]  PPM/ICD - Denies Device Orders - n/a Rep Notified - n/a  Chest x-ray - n/a EKG - March 2025 - tracing requested Stress Test - Denies ECHO - 03/21/2023 Cardiac Cath - Denies  Sleep Study - +OSA and wears CPAP every night. Pressure setting is 10  No DM  Last dose of GLP1 agonist- n/a GLP1 instructions: n/a  Blood Thinner Instructions: Pt instructed to hold Eliquis  for one week. Last dose was July 30th Aspirin Instructions: n/a  ERAS Protcol - Clear liquids until 0700 morning of surgery PRE-SURGERY Ensure or G2- n/a  COVID TEST- n/a   Anesthesia review: Yes. Cardiac Clearance.    Patient denies shortness of breath, fever, cough and chest pain at PAT appointment. Pt denies any respiratory illness/infection in the last two months.    All instructions explained to the patient, with a verbal understanding of the material. Patient agrees to go over the instructions while at home for a better understanding. Patient also instructed to self quarantine after being tested for COVID-19. The opportunity to ask questions was provided.

## 2024-07-01 NOTE — Progress Notes (Addendum)
 Anesthesia Chart Review:  Case: [id number] Date/Time: 07/02/24 0945   Procedure: THYROIDECTOMY (Bilateral)   Anesthesia type: General   Diagnosis: Hyperthyroidism [E05.90]   Pre-op diagnosis: Hyperthyroidism   Location: [facility] OR ROOM 09 / [facility] OR   Surgeons: Carlie Clark, MD       DISCUSSION: Patient is a 78 year old female scheduled for the above procedure.  History includes former smoker (quit 1970), hyperthyroidism/Graves disease (declined radioactive iodine ; favored methimazole  or thyroid  surgery), goiter, PE (10/03/22), PAF (new onset 09/2022 in setting of thyrotoxicosis; s/p DCCV 01/04/23), skin cancer (BCC, s/p Mohs), OSA (uses CPAP), ocular migraines, macular degeneration, appendectomy.   She reported propofol  makes her extremely emotional upon waking and said she had side effects of palpitations with propranolol  and diltiazem  (when used to treat afib in setting of hyperthyroidism).   She was diagnosed with afib on 10/03/22. She had been diagnosed with hyperthyroidism in June 2023. Notes suggest she wanted to treat initially with herbal supplements/homeopathic remedies, but was eventually started on propranolol  and PTU after symptoms worsening. She stopped propranolol  due to side effect of weakness in October 2023. On 10/03/22 she was evaluated in the ED after several days of worsening palpitations and dyspnea and was diagnosed with new onset afib at [facility]. CTA also demonstrated multiple PE in the segmental arteries of hte RLL. TSH was below measurable limits, T4 free 4.10, BNP 585. She was started on a heparin  GTT, PTU, propranolol . 300mg  of hydrocortisone  were given and Lugol's solution was started. She was transferred to [facility] (where her Elspeth works in the PACU) on 11/ 8/23 for further management of thyroid  storm, PE and afib with RVR. LE Doppler negative for DVT. TTE showed EF 40-45%. She remained in afib, but rate controlled on Cardizem  gtt. Discharge  medications included propranolol  ER 60 mg, apixaban  5 mg twice daily, diltiazem  180 mg daily, methimazole  20 mg daily.  Outpatient endocrinology and cardiology follow-up arranged. Plan to repeat TTE once back in [place].   She required DCCV in the ED on 01/04/23 for recurrent afib with RVR in the 170's. She had stopped her Cardizem  and propranolol  because she noted  facial swelling, ear pressure that radiates to her neck after taking her medications. She notices this everyday about an hour. Cardizem  was stopped initially on 12/27/21 followed by propranolol  on 01/02/24 after she reported persistent symptoms and normalization of her thyroid  function. (She described side effects of propranolol  and diltiazem  as more of palpitations.) Following DCCV, it sounds like methimazole  was resumed for recurrent decrease in TSH. She also opted to reduce Eliquis  to 2.5 mg BID (from recommended 5 mg BID). She had been followed by Acharya, Gayatri, MD in [place], but is now followed by Dr. Andra in [place], [place]. She denied any recurrent afib issues. TTE on 03/21/23 showed LVEF 70-75%, no RWMA, grade 1 DD, normal RV systolic function, trivial MR.     She brought in a copy of her 06/17/24 PCP note by Dr. Cammie as well as labs from 06/12/24 that showed normal TSH at 1.19, normal free T4 of 1.31, elevated thyroglobulin antibody at 327, and elevated thyroid  peroxidase antibody at 37. He noted plans for thyroidectomy. (Notes on shadow chart.)  Her cardiologist is Dr. Alm Andra with [facility] in [place], [place]. He signed a letter of medical and cardiac clearance for surgery (scanned under Media tab). She reported instructions to hold Eliquis  for on week prior to surgery, last dose 06/25/24. 02/06/24 EKG and last office  are scanned under Media tab. She was in NSR at last visit. She had stopped her Eliquis , but he recommended she resume to prevent stroke (which she did until held for surgery). He plans to update echo next  year.     Anesthesia team to evaluate on the day of surgery. Of note, her son is a PACU RN with [employer].   VS: BP (!) 173/82   Pulse 69   Temp 36.6 C   Resp 16   Ht 5' 1.5 (1.562 m)   Wt 83.5 kg   SpO2 99%   BMI 34.20 kg/m    PROVIDERS: PCP - Curly HILARIO Pacini, DO Cardiologist - Dr. Alm Endo with [facility]    LABS: Labs reviewed: Acceptable for surgery. See also DISCUSSION.  (all labs ordered are listed, but only abnormal results are displayed)  Labs Reviewed  CBC - Abnormal; Notable for the following components:      Result Value   Hemoglobin 15.2 (*)    MCV 103.9 (*)    MCH 34.8 (*)    All other components within normal limits  BASIC METABOLIC PANEL WITH GFR    IMAGES: 1V CXR 01/04/23: FINDINGS: Cardiac silhouette is prominent. There is pulmonary interstitial prominence with vascular congestion. No focal consolidation. No pneumothorax or pleural effusion identified. Aorta is calcified.  IMPRESSION: Findings suggest CHF.   EKG: EKG 02/06/24 (Dr. Endo): NSR  EKG 01/31/23: NSR.    CV: Echo 03/21/23: IMPRESSIONS   1. Mildly elevated LVOT gradient (2.5 m/s) possibly due to vigorous LV  function; aortic valve visually opens well.   2. Left ventricular ejection fraction, by estimation, is 70 to 75%. The  left ventricle has hyperdynamic function. The left ventricle has no  regional wall motion abnormalities. There is mild left ventricular  hypertrophy. Left ventricular diastolic  parameters are consistent with Grade I diastolic dysfunction (impaired  relaxation).   3. Right ventricular systolic function is normal. The right ventricular  size is normal.   4. The mitral valve is normal in structure. Trivial mitral valve  regurgitation. No evidence of mitral stenosis.   5. The aortic valve is tricuspid. Aortic valve regurgitation is not  visualized. Aortic valve sclerosis is present, with no evidence of aortic  valve stenosis.   6.  The inferior vena cava is normal in size with greater than 50%  respiratory variability, suggesting right atrial pressure of 3 mmHg.  - Comparison 10/06/22: LVEF 45-50%, LV global hypokinesis, mildly dilated LV internal cavity, low normal RV systolic function, severely dilated LA, mildly dilated RA, trivial MR/AR; 05/25/22 [facility]: LVEF 70-75%, RVSP 46 mmHg, mild TR, mild-moderate pulmonary hypertension.   Past Medical History:  Diagnosis Date   Allergy    Arthritis    Cancer (HCC)    basil cell ca skin   Complication of anesthesia    Palpitations/A. Fib with Propranolol  and Diltiazem . Propofol  makes her extremely emotional upon waking   Dysrhythmia    A. Fib related to Thyroid  Storm in November 2023   Headache    Occular Migraines   History of shingles    eyes   Hypertension    r/t hyperthyroid - isolated systolic hypertension   Hyperthyroidism    Macular degeneration of both eyes    PE (pulmonary thromboembolism) (HCC) 10/03/2022   In setting of thyroid  storm with afib with RVR   Sleep apnea    Wears CPAP nightly    Past Surgical History:  Procedure Laterality Date  APPENDECTOMY     DILATION AND CURETTAGE OF UTERUS     x3   KNEE ARTHROSCOPY     left   MOHS SURGERY     face and leg   TONSILLECTOMY     age 67 and 80    MEDICATIONS:  apixaban  (ELIQUIS ) 2.5 MG TABS tablet   Bioflavonoid Products (QUERCETIN COMPLEX IMMUNE PO)   cloNIDine  (CATAPRES ) 0.1 MG tablet   cyanocobalamin  (VITAMIN B12) 1000 MCG tablet   ELDERBERRY PO   EPINEPHrine  (EPIPEN  2-PAK) 0.3 mg/0.3 mL IJ SOAJ injection   fluticasone (FLONASE) 50 MCG/ACT nasal spray   KRILL OIL PO   levothyroxine  (SYNTHROID ) 100 MCG tablet   loratadine (CLARITIN) 10 MG tablet   MAGNESIUM PO   methimazole  (TAPAZOLE ) 10 MG tablet   Multiple Vitamin (MULTIVITAMIN) tablet   Multiple Vitamins-Minerals (CITRACAL +D3 PO)   Multiple Vitamins-Minerals (PRESERVISION AREDS 2 PO)   olmesartan  (BENICAR ) 20 MG tablet    OVER THE COUNTER MEDICATION   OVER THE COUNTER MEDICATION   Probiotic Product (PROBIOTIC DAILY PO)   Thiamine HCl (THIAMINE PO)   Turmeric (QC TUMERIC COMPLEX) 500 MG CAPS   VITAMIN D-VITAMIN K PO   No current facility-administered medications for this encounter.    Isaiah Ruder, PA-C Surgical Short Stay/Anesthesiology [facility] Phone [phone] [facility] Phone [phone] 07/01/2024 5:11 PM

## 2024-07-01 NOTE — Anesthesia Preprocedure Evaluation (Signed)
 Anesthesia Evaluation  Patient identified by MRN, date of birth, ID band Patient awake    Reviewed: Allergy & Precautions, H&P , NPO status , Patient's Chart, lab work & pertinent test results  Airway Mallampati: II  TM Distance: >3 FB Neck ROM: Full    Dental no notable dental hx. (+) Teeth Intact, Dental Advisory Given   Pulmonary sleep apnea and Continuous Positive Airway Pressure Ventilation , former smoker   Pulmonary exam normal breath sounds clear to auscultation       Cardiovascular hypertension, Pt. on medications + dysrhythmias Atrial Fibrillation  Rhythm:Regular Rate:Normal     Neuro/Psych  Headaches  negative psych ROS   GI/Hepatic negative GI ROS, Neg liver ROS,,,  Endo/Other   Hyperthyroidism   Renal/GU negative Renal ROS  negative genitourinary   Musculoskeletal  (+) Arthritis , Osteoarthritis,    Abdominal   Peds  Hematology negative hematology ROS (+)   Anesthesia Other Findings   Reproductive/Obstetrics negative OB ROS                              Anesthesia Physical Anesthesia Plan  ASA: 3  Anesthesia Plan: General   Post-op Pain Management: Tylenol  PO (pre-op)*   Induction: Intravenous  PONV Risk Score and Plan: 4 or greater and Ondansetron , Dexamethasone  and Treatment may vary due to age or medical condition  Airway Management Planned: Oral ETT  Additional Equipment:   Intra-op Plan:   Post-operative Plan: Extubation in OR  Informed Consent: I have reviewed the patients History and Physical, chart, labs and discussed the procedure including the risks, benefits and alternatives for the proposed anesthesia with the patient or authorized representative who has indicated his/her understanding and acceptance.     Dental advisory given  Plan Discussed with: CRNA  Anesthesia Plan Comments: (PAT note written 07/01/2024 by Sianne Tejada, PA-C.  )          Anesthesia Quick Evaluation

## 2024-07-02 ENCOUNTER — Observation Stay (HOSPITAL_COMMUNITY)
Admission: RE | Admit: 2024-07-02 | Discharge: 2024-07-03 | Disposition: A | Discharge status: Home / Self Care | Attending: Otolaryngology | Admitting: Otolaryngology

## 2024-07-02 ENCOUNTER — Encounter (HOSPITAL_COMMUNITY)
Admission: RE | Disposition: A | Discharge status: Home / Self Care | Payer: Self-pay | Source: Home / Self Care | Attending: Otolaryngology

## 2024-07-02 ENCOUNTER — Ambulatory Visit (HOSPITAL_COMMUNITY): Payer: Self-pay | Admitting: Anesthesiology

## 2024-07-02 ENCOUNTER — Encounter (HOSPITAL_COMMUNITY): Payer: Self-pay | Admitting: Otolaryngology

## 2024-07-02 ENCOUNTER — Other Ambulatory Visit: Payer: Self-pay

## 2024-07-02 DIAGNOSIS — Z79899 Other long term (current) drug therapy: Secondary | ICD-10-CM | POA: Insufficient documentation

## 2024-07-02 DIAGNOSIS — E0590 Thyrotoxicosis, unspecified without thyrotoxic crisis or storm: Principal | ICD-10-CM | POA: Insufficient documentation

## 2024-07-02 DIAGNOSIS — Z91040 Latex allergy status: Secondary | ICD-10-CM | POA: Diagnosis not present

## 2024-07-02 DIAGNOSIS — Z87891 Personal history of nicotine dependence: Secondary | ICD-10-CM | POA: Diagnosis not present

## 2024-07-02 DIAGNOSIS — I10 Essential (primary) hypertension: Secondary | ICD-10-CM | POA: Diagnosis not present

## 2024-07-02 DIAGNOSIS — G4733 Obstructive sleep apnea (adult) (pediatric): Secondary | ICD-10-CM | POA: Diagnosis not present

## 2024-07-02 HISTORY — PX: THYROIDECTOMY: SHX17

## 2024-07-02 LAB — CALCIUM: Calcium: 8.4 mg/dL — ABNORMAL LOW (ref 8.9–10.3)

## 2024-07-02 SURGERY — THYROIDECTOMY
Anesthesia: General | Site: Neck | Laterality: Bilateral

## 2024-07-02 MED ORDER — DEXAMETHASONE SODIUM PHOSPHATE 10 MG/ML IJ SOLN
INTRAMUSCULAR | Status: DC | PRN
  Administered 2024-07-02: 10 mg via INTRAVENOUS

## 2024-07-02 MED ORDER — 0.9 % SODIUM CHLORIDE (POUR BTL) OPTIME
TOPICAL | Status: DC | PRN
  Administered 2024-07-02: 1000 mL

## 2024-07-02 MED ORDER — ACETAMINOPHEN 325 MG PO TABS
650.0000 mg | ORAL_TABLET | Freq: Four times a day (QID) | ORAL | Status: DC | PRN
  Administered 2024-07-02 – 2024-07-03 (×2): 650 mg via ORAL
  Filled 2024-07-02 (×2): qty 2

## 2024-07-02 MED ORDER — ONDANSETRON HCL 4 MG/2ML IJ SOLN
INTRAMUSCULAR | Status: DC | PRN
  Administered 2024-07-02: 4 mg via INTRAVENOUS

## 2024-07-02 MED ORDER — LEVOTHYROXINE SODIUM 100 MCG PO TABS
100.0000 ug | ORAL_TABLET | Freq: Every day | ORAL | Status: DC
  Administered 2024-07-03: 100 ug via ORAL
  Filled 2024-07-02: qty 1

## 2024-07-02 MED ORDER — FENTANYL CITRATE (PF) 100 MCG/2ML IJ SOLN
25.0000 ug | INTRAMUSCULAR | Status: DC | PRN
  Administered 2024-07-02 (×3): 25 ug via INTRAVENOUS

## 2024-07-02 MED ORDER — SODIUM CHLORIDE 0.9 % IV SOLN: INTRAVENOUS | Status: DC | PRN

## 2024-07-02 MED ORDER — PROPOFOL 500 MG/50ML IV EMUL
INTRAVENOUS | Status: DC | PRN
  Administered 2024-07-02 (×2): 125 ug/kg/min via INTRAVENOUS

## 2024-07-02 MED ORDER — METHIMAZOLE 5 MG PO TABS
5.0000 mg | ORAL_TABLET | Freq: Every day | ORAL | Status: DC
  Filled 2024-07-02 (×2): qty 1

## 2024-07-02 MED ORDER — HEMOSTATIC AGENTS (NO CHARGE) OPTIME
TOPICAL | Status: DC | PRN
Start: 2024-07-02 — End: 2024-07-02
  Administered 2024-07-02: 1 via TOPICAL

## 2024-07-02 MED ORDER — SUCCINYLCHOLINE CHLORIDE 200 MG/10ML IV SOSY
PREFILLED_SYRINGE | INTRAVENOUS | Status: AC
Start: 2024-07-02 — End: 2024-07-02
  Filled 2024-07-02: qty 10

## 2024-07-02 MED ORDER — MORPHINE SULFATE (PF) 2 MG/ML IV SOLN: 2.0000 mg | INTRAVENOUS | Status: DC | PRN

## 2024-07-02 MED ORDER — EPHEDRINE SULFATE-NACL 50-0.9 MG/10ML-% IV SOSY
PREFILLED_SYRINGE | INTRAVENOUS | Status: DC | PRN
  Administered 2024-07-02 (×2): 7.5 mg via INTRAVENOUS

## 2024-07-02 MED ORDER — LIDOCAINE 2% (20 MG/ML) 5 ML SYRINGE
INTRAMUSCULAR | Status: AC
  Filled 2024-07-02: qty 5

## 2024-07-02 MED ORDER — SODIUM CHLORIDE 0.9 % IV SOLN
INTRAVENOUS | Status: DC | PRN
  Administered 2024-07-02: .1 ug/kg/min via INTRAVENOUS

## 2024-07-02 MED ORDER — LIDOCAINE-EPINEPHRINE 1 %-1:100000 IJ SOLN
INTRAMUSCULAR | Status: DC | PRN
  Administered 2024-07-02: 7 mL

## 2024-07-02 MED ORDER — PHENYLEPHRINE HCL-NACL 20-0.9 MG/250ML-% IV SOLN
INTRAVENOUS | Status: DC | PRN
  Administered 2024-07-02: 25 ug/min via INTRAVENOUS

## 2024-07-02 MED ORDER — PROPOFOL 10 MG/ML IV BOLUS
INTRAVENOUS | Status: AC
  Filled 2024-07-02: qty 20

## 2024-07-02 MED ORDER — CHLORHEXIDINE GLUCONATE 0.12 % MT SOLN
15.0000 mL | Freq: Once | OROMUCOSAL | Status: AC
  Administered 2024-07-02: 15 mL via OROMUCOSAL
  Filled 2024-07-02: qty 15

## 2024-07-02 MED ORDER — FENTANYL CITRATE (PF) 250 MCG/5ML IJ SOLN
INTRAMUSCULAR | Status: AC
  Filled 2024-07-02: qty 5

## 2024-07-02 MED ORDER — FENTANYL CITRATE (PF) 250 MCG/5ML IJ SOLN
INTRAMUSCULAR | Status: DC | PRN
  Administered 2024-07-02 (×2): 50 ug via INTRAVENOUS

## 2024-07-02 MED ORDER — PHENYLEPHRINE 80 MCG/ML (10ML) SYRINGE FOR IV PUSH (FOR BLOOD PRESSURE SUPPORT)
PREFILLED_SYRINGE | INTRAVENOUS | Status: DC | PRN
  Administered 2024-07-02: 160 ug via INTRAVENOUS
  Administered 2024-07-02: 80 ug via INTRAVENOUS
  Administered 2024-07-02: 160 ug via INTRAVENOUS

## 2024-07-02 MED ORDER — LACTATED RINGERS IV SOLN: INTRAVENOUS | Status: DC

## 2024-07-02 MED ORDER — PHENYLEPHRINE 80 MCG/ML (10ML) SYRINGE FOR IV PUSH (FOR BLOOD PRESSURE SUPPORT)
PREFILLED_SYRINGE | INTRAVENOUS | Status: AC
  Filled 2024-07-02: qty 10

## 2024-07-02 MED ORDER — LACTATED RINGERS IV SOLN: INTRAVENOUS | Status: DC | PRN

## 2024-07-02 MED ORDER — ONDANSETRON HCL 4 MG/2ML IJ SOLN
INTRAMUSCULAR | Status: AC
  Filled 2024-07-02: qty 2

## 2024-07-02 MED ORDER — VASOPRESSIN 20 UNIT/ML IV SOLN
INTRAVENOUS | Status: AC
  Filled 2024-07-02: qty 1

## 2024-07-02 MED ORDER — ONDANSETRON HCL 4 MG PO TABS: 4.0000 mg | ORAL_TABLET | ORAL | Status: DC | PRN

## 2024-07-02 MED ORDER — ONDANSETRON HCL 4 MG/2ML IJ SOLN: 4.0000 mg | INTRAMUSCULAR | Status: DC | PRN

## 2024-07-02 MED ORDER — FENTANYL CITRATE (PF) 100 MCG/2ML IJ SOLN
INTRAMUSCULAR | Status: AC
  Filled 2024-07-02: qty 2

## 2024-07-02 MED ORDER — HYDROCODONE-ACETAMINOPHEN 5-325 MG PO TABS: 1.0000 | ORAL_TABLET | ORAL | Status: DC | PRN

## 2024-07-02 MED ORDER — KCL IN DEXTROSE-NACL 20-5-0.45 MEQ/L-%-% IV SOLN
INTRAVENOUS | Status: DC
  Filled 2024-07-02: qty 1000

## 2024-07-02 MED ORDER — LIDOCAINE 2% (20 MG/ML) 5 ML SYRINGE
INTRAMUSCULAR | Status: DC | PRN
  Administered 2024-07-02: 60 mg via INTRAVENOUS

## 2024-07-02 MED ORDER — ORAL CARE MOUTH RINSE: 15.0000 mL | Freq: Once | OROMUCOSAL | Status: AC

## 2024-07-02 MED ORDER — PROPOFOL 10 MG/ML IV BOLUS
INTRAVENOUS | Status: DC | PRN
  Administered 2024-07-02 (×2): 50 mg via INTRAVENOUS
  Administered 2024-07-02: 120 mg via INTRAVENOUS

## 2024-07-02 MED ORDER — DEXAMETHASONE SODIUM PHOSPHATE 10 MG/ML IJ SOLN
INTRAMUSCULAR | Status: AC
  Filled 2024-07-02: qty 1

## 2024-07-02 MED ORDER — EPHEDRINE 5 MG/ML INJ
INTRAVENOUS | Status: AC
  Filled 2024-07-02: qty 5

## 2024-07-02 MED ORDER — CLONIDINE HCL 0.1 MG PO TABS
0.1000 mg | ORAL_TABLET | Freq: Two times a day (BID) | ORAL | Status: DC
  Administered 2024-07-02 – 2024-07-03 (×2): 0.1 mg via ORAL
  Filled 2024-07-02 (×2): qty 1

## 2024-07-02 MED ORDER — CEFAZOLIN SODIUM-DEXTROSE 2-4 GM/100ML-% IV SOLN
2.0000 g | INTRAVENOUS | Status: AC
  Administered 2024-07-02: 2 g via INTRAVENOUS
  Filled 2024-07-02: qty 100

## 2024-07-02 MED ORDER — VITAMIN B-12 1000 MCG PO TABS
1000.0000 ug | ORAL_TABLET | Freq: Every day | ORAL | Status: DC
  Administered 2024-07-02 – 2024-07-03 (×2): 1000 ug via ORAL
  Filled 2024-07-02 (×2): qty 1

## 2024-07-02 MED ORDER — ACETAMINOPHEN 500 MG PO TABS
1000.0000 mg | ORAL_TABLET | Freq: Once | ORAL | Status: DC
  Filled 2024-07-02: qty 2

## 2024-07-02 MED ORDER — SUCCINYLCHOLINE CHLORIDE 200 MG/10ML IV SOSY
PREFILLED_SYRINGE | INTRAVENOUS | Status: DC | PRN
  Administered 2024-07-02: 80 mg via INTRAVENOUS

## 2024-07-02 MED ORDER — PHENOL 1.4 % MT LIQD
1.0000 | OROMUCOSAL | Status: DC | PRN
  Administered 2024-07-02: 1 via OROMUCOSAL
  Filled 2024-07-02 (×2): qty 177

## 2024-07-02 MED ORDER — LIDOCAINE-EPINEPHRINE 1 %-1:100000 IJ SOLN
INTRAMUSCULAR | Status: AC
Start: 2024-07-02 — End: 2024-07-02
  Filled 2024-07-02: qty 1

## 2024-07-02 MED ORDER — REMIFENTANIL HCL 2 MG IV SOLR
INTRAVENOUS | Status: AC
  Filled 2024-07-02: qty 2000

## 2024-07-02 SURGICAL SUPPLY — 41 items
BAG COUNTER SPONGE SURGICOUNT (BAG) ×1 IMPLANT
BLADE SURG 15 STRL LF DISP TIS (BLADE) IMPLANT
CANISTER SUCTION 3000ML PPV (SUCTIONS) ×1 IMPLANT
CLEANER TIP ELECTROSURG 2X2 (MISCELLANEOUS) ×1 IMPLANT
CNTNR URN SCR LID CUP LEK RST (MISCELLANEOUS) IMPLANT
CORD BIPOLAR FORCEPS 12FT (ELECTRODE) ×1 IMPLANT
COVER SURGICAL LIGHT HANDLE (MISCELLANEOUS) ×1 IMPLANT
DERMABOND ADVANCED .7 DNX12 (GAUZE/BANDAGES/DRESSINGS) ×1 IMPLANT
DRAIN JP 10F RND RADIO (DRAIN) IMPLANT
DRAIN JP 7F FLT 3/4 PRF SI HBL (DRAIN) IMPLANT
DRAPE HALF SHEET 40X57 (DRAPES) IMPLANT
ELECT COATED BLADE 2.86 ST (ELECTRODE) ×1 IMPLANT
ELECTRODE REM PT RTRN 9FT ADLT (ELECTROSURGICAL) ×1 IMPLANT
EVACUATOR SILICONE 100CC (DRAIN) ×1 IMPLANT
FORCEPS BIPOLAR SPETZLER 8 1.0 (NEUROSURGERY SUPPLIES) ×1 IMPLANT
GAUZE 4X4 16PLY ~~LOC~~+RFID DBL (SPONGE) ×1 IMPLANT
GLOVE BIO SURGEON STRL SZ7.5 (GLOVE) ×1 IMPLANT
GOWN STRL REUS W/ TWL LRG LVL3 (GOWN DISPOSABLE) ×2 IMPLANT
HEMOSTAT SURGICEL 2X14 (HEMOSTASIS) ×1 IMPLANT
KIT BASIN OR (CUSTOM PROCEDURE TRAY) ×1 IMPLANT
KIT TURNOVER KIT B (KITS) ×1 IMPLANT
LOCATOR NERVE 3 VOLT (DISPOSABLE) IMPLANT
NDL HYPO 25GX1X1/2 BEV (NEEDLE) ×1 IMPLANT
NEEDLE HYPO 25GX1X1/2 BEV (NEEDLE) ×1 IMPLANT
NS IRRIG 1000ML POUR BTL (IV SOLUTION) ×1 IMPLANT
PAD ARMBOARD POSITIONER FOAM (MISCELLANEOUS) ×2 IMPLANT
PENCIL SMOKE EVACUATOR (MISCELLANEOUS) ×1 IMPLANT
POSITIONER HEAD DONUT 9IN (MISCELLANEOUS) IMPLANT
PROBE NERVBE PRASS .33 (MISCELLANEOUS) IMPLANT
SET WALTER ACTIVATION W/DRAPE (SET/KITS/TRAYS/PACK) IMPLANT
SHEARS HARMONIC 9CM CVD (BLADE) ×1 IMPLANT
SPONGE INTESTINAL PEANUT (DISPOSABLE) IMPLANT
STAPLER SKIN PROX 35W (STAPLE) ×1 IMPLANT
SUT ETHILON 2 0 FS 18 (SUTURE) ×1 IMPLANT
SUT SILK 3 0 REEL (SUTURE) ×1 IMPLANT
SUT VIC AB 3-0 SH 27X BRD (SUTURE) ×1 IMPLANT
SUT VIC AB 4-0 PS2 18 (SUTURE) ×1 IMPLANT
TOWEL GREEN STERILE FF (TOWEL DISPOSABLE) ×1 IMPLANT
TRAY ENT MC OR (CUSTOM PROCEDURE TRAY) ×1 IMPLANT
TUBE 10FR 43IN 5G WT TIP ENFIT (TUBING) IMPLANT
TUBE ENDOTRAC NIMS EMG 7MM (MISCELLANEOUS) IMPLANT

## 2024-07-02 NOTE — H&P (Signed)
 Marissa Parker is an 78 y.o. female.   Chief Complaint: Hyperthyroidism HPI: 78 year old female with Grave's disease who has elected to proceed with total thyroidectomy.  Past Medical History:  Diagnosis Date   Allergy    Arthritis    Cancer (HCC)    basil cell ca skin   Complication of anesthesia    Palpitations/A. Fib with Propranolol  and Diltiazem . Propofol  makes her extremely emotional upon waking   Dysrhythmia    A. Fib related to Thyroid  Storm in November 2023   Headache    Occular Migraines   History of shingles    eyes   Hypertension    r/t hyperthyroid - isolated systolic hypertension   Hyperthyroidism    Macular degeneration of both eyes    PE (pulmonary thromboembolism) (HCC) 10/03/2022   In setting of thyroid  storm with afib with RVR   Sleep apnea    Wears CPAP nightly    Past Surgical History:  Procedure Laterality Date   APPENDECTOMY     DILATION AND CURETTAGE OF UTERUS     x3   KNEE ARTHROSCOPY     left   MOHS SURGERY     face and leg   TONSILLECTOMY     age 39 and 58    Family History  Problem Relation Age of Onset   Heart attack Mother    Aneurysm Mother    Colon cancer Father    Social History:  reports that she quit smoking about 55 years ago. Her smoking use included cigarettes. She has never used smokeless tobacco. She reports that she does not currently use alcohol. She reports that she does not use drugs.  Allergies:  Allergies  Allergen Reactions   Bacitracin-Neomycin-Polymyxin Hives, Itching and Other (See Comments)    Turned skin purple    Latex Hives and Itching   Adhesive [Tape] Other (See Comments)    Welts   Avocado Other (See Comments)    Due to latex allergy    Banana Other (See Comments)    Due to latex allergy    Egg Yolk Other (See Comments)    Abdominal pain -does not take flu shot due to egg yolk allergy   Glucagon Nausea Only   Kiwi Extract Other (See Comments)    Due to latex allergy    [place] Wort Other (See  Comments)    abd cramping   Diazepam Nausea And Vomiting   Diltiazem  Hcl Er Palpitations   Meperidine Nausea And Vomiting   Propranolol  Hcl Palpitations    Medications Prior to Admission  Medication Sig Dispense Refill   apixaban  (ELIQUIS ) 2.5 MG TABS tablet Take 2.5 mg by mouth 2 (two) times daily.     Bioflavonoid Products (QUERCETIN COMPLEX IMMUNE PO) Take 1 capsule by mouth in the morning and at bedtime.     cloNIDine  (CATAPRES ) 0.1 MG tablet Take 0.1 mg by mouth 2 (two) times daily.     cyanocobalamin  (VITAMIN B12) 1000 MCG tablet Take 1,000 mcg by mouth daily.     ELDERBERRY PO Take 1 tablet by mouth daily.     fluticasone (FLONASE) 50 MCG/ACT nasal spray Place 1 spray into both nostrils daily as needed for allergies.     KRILL OIL PO Take 1 capsule by mouth in the morning and at bedtime.     loratadine (CLARITIN) 10 MG tablet Take 10 mg by mouth daily as needed for allergies.     MAGNESIUM PO Take 1 tablet by mouth  at bedtime.     methimazole  (TAPAZOLE ) 10 MG tablet Take 5 mg by mouth daily.     Multiple Vitamin (MULTIVITAMIN) tablet Taking one capsule by mouth daily Doterra - TerraZyme Digestive Enzyme Complex     Multiple Vitamins-Minerals (CITRACAL +D3 PO) Take 1 tablet by mouth in the morning and at bedtime.     Multiple Vitamins-Minerals (PRESERVISION AREDS 2 PO) Take 1 capsule by mouth in the morning and at bedtime.     OVER THE COUNTER MEDICATION Take 1 capsule by mouth in the morning and at bedtime. Fruits OTC vitamin     OVER THE COUNTER MEDICATION Take 1 capsule by mouth in the morning and at bedtime. Vegetables OTC vitamin     Probiotic Product (PROBIOTIC DAILY PO) Take 1 capsule by mouth daily.     Thiamine HCl (THIAMINE PO) Take 1 capsule by mouth daily.     VITAMIN D-VITAMIN K PO Take 1 tablet by mouth daily.     EPINEPHrine  (EPIPEN  2-PAK) 0.3 mg/0.3 mL IJ SOAJ injection Inject 0.3 mLs (0.3 mg total) into the muscle as needed (life-threatening allergic reaction). 2  Device 2   levothyroxine  (SYNTHROID ) 100 MCG tablet Take 100 mcg by mouth daily before breakfast.     olmesartan  (BENICAR ) 20 MG tablet Take 1 tablet (20 mg total) by mouth daily. (Patient not taking: Reported on 06/26/2024) 90 tablet 3   Turmeric (QC TUMERIC COMPLEX) 500 MG CAPS Take 1 capsule by mouth at bedtime.      Results for orders placed or performed during the hospital encounter of 07/01/24 (from the past 48 hours)  Basic metabolic panel per protocol     Status: None   Collection Time: 07/01/24 11:55 AM  Result Value Ref Range   Sodium 140 135 - 145 mmol/L   Potassium 4.2 3.5 - 5.1 mmol/L   Chloride 105 98 - 111 mmol/L   CO2 25 22 - 32 mmol/L   Glucose, Bld 99 70 - 99 mg/dL    Comment: Glucose reference range applies only to samples taken after fasting for at least 8 hours.   BUN 17 8 - 23 mg/dL   Creatinine, Ser 9.25 0.44 - 1.00 mg/dL   Calcium 9.5 8.9 - 10.3 mg/dL   GFR, Estimated >39 >39 mL/min    Comment: (NOTE) Calculated using the CKD-EPI Creatinine Equation (2021)    Anion gap 10 5 - 15    Comment: Performed at [facility], [address] [place], [place] [address]  CBC per protocol     Status: Abnormal   Collection Time: 07/01/24 11:55 AM  Result Value Ref Range   WBC 6.2 4.0 - 10.5 K/uL   RBC 4.37 3.87 - 5.11 MIL/uL   Hemoglobin 15.2 (H) 12.0 - 15.0 g/dL   HCT 54.5 63.9 - 53.9 %   MCV 103.9 (H) 80.0 - 100.0 fL   MCH 34.8 (H) 26.0 - 34.0 pg   MCHC 33.5 30.0 - 36.0 g/dL   RDW 87.0 88.4 - 84.4 %   Platelets 209 150 - 400 K/uL   nRBC 0.0 0.0 - 0.2 %    Comment: Performed at [facility], [address] [place], [place] [address]   No results found.  Review of Systems  All other systems reviewed and are negative.   Blood pressure (!) 165/80, pulse 73, temperature 97.8 F (36.6 C), temperature source Oral, resp. rate 19, height 5' 5 (1.651 m), weight 83.5 kg, SpO2 98%. Physical Exam  Constitutional:      Appearance: Normal appearance.   HENT:     Head: Normocephalic and atraumatic.     Right Ear: External ear normal.     Left Ear: External ear normal.     Nose: Nose normal.     Mouth/Throat:     Mouth: Mucous membranes are moist.  Eyes:     Extraocular Movements: Extraocular movements intact.     Pupils: Pupils are equal, round, and reactive to light.  Cardiovascular:     Rate and Rhythm: Normal rate.  Pulmonary:     Effort: Pulmonary effort is normal.  Skin:    General: Skin is warm and dry.  Neurological:     General: No focal deficit present.     Mental Status: She is alert and oriented to person, place, and time.  Psychiatric:        Mood and Affect: Mood normal.        Behavior: Behavior normal.        Thought Content: Thought content normal.        Judgment: Judgment normal.      Assessment/Plan Hyperthyroidism  To OR for total thyroidectomy.  Vaughan Ricker, MD 07/02/2024, 8:43 AM

## 2024-07-02 NOTE — Transfer of Care (Signed)
 Immediate Anesthesia Transfer of Care Note  Patient: Marissa Parker  Procedure(s) Performed: THYROIDECTOMY (Bilateral: Neck)  Patient Location: PACU  Anesthesia Type:General  Level of Consciousness: awake, alert , and oriented  Airway & Oxygen Therapy: Patient Spontanous Breathing and Patient connected to nasal cannula oxygen  Post-op Assessment: Report given to RN and Post -op Vital signs reviewed and stable  Post vital signs: Reviewed and stable  Last Vitals:  Vitals Value Taken Time  BP 146/79 07/02/24 12:02  Temp    Pulse 88 07/02/24 12:06  Resp 23 07/02/24 12:06  SpO2 91 % 07/02/24 12:06  Vitals shown include unfiled device data.  Last Pain:  Vitals:   07/02/24 0848  TempSrc:   PainSc: 0-No pain         Complications: No notable events documented.

## 2024-07-02 NOTE — Progress Notes (Signed)
 Pt requesting plain tylenol  for pain instead of [place], ENT on call paged.

## 2024-07-02 NOTE — Anesthesia Postprocedure Evaluation (Signed)
 Anesthesia Post Note  Patient: Marissa Parker  Procedure(s) Performed: THYROIDECTOMY (Bilateral: Neck)     Patient location during evaluation: PACU Anesthesia Type: General Level of consciousness: awake and alert Pain management: pain level controlled Vital Signs Assessment: post-procedure vital signs reviewed and stable Respiratory status: spontaneous breathing, nonlabored ventilation and respiratory function stable Cardiovascular status: blood pressure returned to baseline and stable Postop Assessment: no apparent nausea or vomiting Anesthetic complications: no   No notable events documented.  Last Vitals:  Vitals:   07/02/24 1345 07/02/24 1400  BP: 128/74 126/73  Pulse: 86 85  Resp: 18 (!) 21  Temp:  37.1 C  SpO2: 95% 94%    Last Pain:  Vitals:   07/02/24 1400  TempSrc:   PainSc: Asleep                 Kordelia Severin,W. EDMOND

## 2024-07-02 NOTE — Progress Notes (Signed)
 Pt to 6N16 from the PACU, family at bedside. Anterior neck incision intact with ice pak on and some noted bruising. JP drain to bulb suction.

## 2024-07-02 NOTE — Op Note (Signed)
 Preop diagnosis: Hyperthyroidism Postop diagnosis: same Procedure: Total thyroidectomy Surgeon: Carlie Assist: RNFA Anesth: General and local with 1% lidocaine  with 1:100,000 epinephrine  Compl: None Findings: Normal-appearing thyroid  gland.   Description:  After discussing risks, benefits, and alternatives, the patient was brought to the operative suite and placed on the operative table in the supine position.  Anesthesia was induced and the patient was intubated by the anesthesia team using a NIM tube without difficulty.  The monitor functioned throughout the case.  The eyes were taped closed and a shoulder roll was placed.  The thyroid  incision was marked with a marking pen and injected with local anesthetic.  The neck was prepped and draped in sterile fashion.  The incision was made with a 15 blade scalpel and extended through the subcutaneous and platysma layers with Bovie electrocautery.  The midline raphe of the strap muscles was divided and strap muscles retracted over the right lobe.  Dissection was then performed around the lobe using the harmonic scalpel includig division of the superior and inferior pedicles and dissection around the lateral aspect.  The superior parathyroid gland was visualized and left in place.  Dissection continued around the deep aspect of the lobe while retracting the lobe.  Staying along the capsule, the dissection continued to Berry's ligament that was dissected.  The right lobe was laid back in place while the left lobe was dissected free similarly.  The gland was then completely removed.  Both recurrent laryngeal nerves were visualized and confirmed by nerve stimulation.  Both were kept intact.  The gland was passed to nursing for pathology.  Bleeding was controlled with Bipolar electrocautery after a Valsalva was given.  After irrigating copiously, a few strips of Surgicell were laid in the depth of the wound.  A 7 Fr suction drain was placed in the depth of the wound  and secured at the skin using a 2-0 Nylon suture.  The strap muscles were closed loosely with 3-0 Vicryl suture.  The platysma layer was closed with 3-0 Vicryl in a simple, interrupted fashion.  The subcutaneous layer was similarly closed with 4-0 Vicryl and the skin was closed with glue.  Drapes were removed and the patient was cleaned off.  She was returned to anesthesia for wake-up, was extubated, and moved to the recovery room in stable condition.  She had no evidence of thyroid  storm during the procedure.

## 2024-07-02 NOTE — Anesthesia Procedure Notes (Addendum)
 Procedure Name: Intubation Date/Time: 07/02/2024 9:26 AM  Performed by: Emmitt Millman, CRNAPre-anesthesia Checklist: Patient identified, Emergency Drugs available, Suction available and Patient being monitored Patient Re-evaluated:Patient Re-evaluated prior to induction Oxygen Delivery Method: Circle system utilized Preoxygenation: Pre-oxygenation with 100% oxygen Induction Type: IV induction Ventilation: Mask ventilation without difficulty Laryngoscope Size: Glidescope and 3 Grade View: Grade I Tube type: Oral (NIMS tube) Tube size: 7.0 mm Number of attempts: 2 Airway Equipment and Method: Stylet Placement Confirmation: ETT inserted through vocal cords under direct vision, positive ETCO2 and breath sounds checked- equal and bilateral Secured at: 21 cm Tube secured with: Tape Dental Injury: Teeth and Oropharynx as per pre-operative assessment  Comments: Attempt #1 without tube stylet unable to reach cords; attempt #2 with tube stylet, able to pass ETT gently through cords

## 2024-07-03 ENCOUNTER — Encounter (HOSPITAL_COMMUNITY): Payer: Self-pay | Admitting: Otolaryngology

## 2024-07-03 DIAGNOSIS — E0590 Thyrotoxicosis, unspecified without thyrotoxic crisis or storm: Secondary | ICD-10-CM | POA: Diagnosis not present

## 2024-07-03 LAB — CALCIUM
Calcium: 8.1 mg/dL — ABNORMAL LOW (ref 8.9–10.3)
Calcium: 8.3 mg/dL — ABNORMAL LOW (ref 8.9–10.3)

## 2024-07-03 NOTE — Care Management Obs Status (Signed)
 MEDICARE OBSERVATION STATUS NOTIFICATION   Patient Details  Name: Marissa Parker MRN: [id number] Date of Birth: 1945/12/02   Medicare Observation Status Notification Given:  Yes    Jon Cruel 07/03/2024, 11:56 AM

## 2024-07-03 NOTE — Progress Notes (Signed)
 DC instructions, med list, post op care reviewed with pt and SO.  All questions answered, all belongings returned.  Pt has f/u appt scheduled with PCP and Dr. Carlie.  Pt left unit via WC without incident

## 2024-07-03 NOTE — Discharge Summary (Signed)
 Physician Discharge Summary  Patient ID: Marissa Parker MRN: [id number] DOB/AGE: Mar 28, 1946 78 y.o.  Admit date: 07/02/2024 Discharge date: 07/03/2024  Admission Diagnoses: Hyperthyroidism  Discharge Diagnoses:  Principal Problem:   Hyperthyroidism   Discharged Condition: good  Hospital Course: 78 year old female underwent total thyroidectomy for hyperthyroidism.  See operative note.  She was observed overnight with a drain in place, monitoring her calcium level.  She was stable overnight and her drain was removed on POD 1.  With stable calcium level, she is felt stable for discharge home.  Consults: None  Significant Diagnostic Studies: None  Treatments: surgery: Total thyroidectomy  Discharge Exam: Blood pressure 132/72, pulse 78, temperature 98.2 F (36.8 C), temperature source Oral, resp. rate 18, height 5' 5 (1.651 m), weight 83.5 kg, SpO2 94%. General appearance: alert, cooperative, and no distress Neck: thyroid  incision clean and intact with some bruising, drain removed, normal voice  Disposition: Discharge disposition: 01-Home or Self Care       Discharge Instructions     Diet - low sodium heart healthy   Complete by: As directed    Discharge instructions   Complete by: As directed    Resume normal diet.  Avoid strenuous activity.  OK to shower incision, gently pat dry.  Do not put ointment on the incision.  Take Synthroid  as prescribed.  Tylenol  and/or Motrin for pain.   Increase activity slowly   Complete by: As directed    No wound care   Complete by: As directed       Allergies as of 07/03/2024       Reactions   Bacitracin-neomycin-polymyxin Hives, Itching, Other (See Comments)   Turned skin purple    Latex Hives, Itching   Adhesive [tape] Other (See Comments)   Welts   Avocado Other (See Comments)   Due to latex allergy    Banana Other (See Comments)   Due to latex allergy    Egg Yolk Other (See Comments)   Abdominal pain -does not take flu shot due  to egg yolk allergy   Glucagon Nausea Only   Kiwi Extract Other (See Comments)   Due to latex allergy    [place] Wort Other (See Comments)   abd cramping   Diazepam Nausea And Vomiting   Diltiazem  Hcl Er Palpitations   Meperidine Nausea And Vomiting   Propranolol  Hcl Palpitations        Medication List     TAKE these medications    CITRACAL +D3 PO Take 1 tablet by mouth in the morning and at bedtime.   cloNIDine  0.1 MG tablet Commonly known as: CATAPRES  Take 0.1 mg by mouth 2 (two) times daily.   cyanocobalamin  1000 MCG tablet Commonly known as: VITAMIN B12 Take 1,000 mcg by mouth daily.   ELDERBERRY PO Take 1 tablet by mouth daily.   Eliquis  2.5 MG Tabs tablet Generic drug: apixaban  Take 2.5 mg by mouth 2 (two) times daily.   EPINEPHrine  0.3 mg/0.3 mL Soaj injection Commonly known as: EpiPen  2-Pak Inject 0.3 mLs (0.3 mg total) into the muscle as needed (life-threatening allergic reaction).   fluticasone 50 MCG/ACT nasal spray Commonly known as: FLONASE Place 1 spray into both nostrils daily as needed for allergies.   KRILL OIL PO Take 1 capsule by mouth in the morning and at bedtime.   levothyroxine  100 MCG tablet Commonly known as: SYNTHROID  Take 100 mcg by mouth daily before breakfast.   loratadine 10 MG tablet Commonly known as: CLARITIN Take  10 mg by mouth daily as needed for allergies.   MAGNESIUM PO Take 1 tablet by mouth at bedtime.   methimazole  10 MG tablet Commonly known as: TAPAZOLE  Take 5 mg by mouth daily.   multivitamin tablet Taking one capsule by mouth daily Doterra - TerraZyme Digestive Enzyme Complex   olmesartan  20 MG tablet Commonly known as: BENICAR  Take 1 tablet (20 mg total) by mouth daily.   OVER THE COUNTER MEDICATION Take 1 capsule by mouth in the morning and at bedtime. Fruits OTC vitamin   OVER THE COUNTER MEDICATION Take 1 capsule by mouth in the morning and at bedtime. Vegetables OTC vitamin   PRESERVISION  AREDS 2 PO Take 1 capsule by mouth in the morning and at bedtime.   PROBIOTIC DAILY PO Take 1 capsule by mouth daily.   QC Tumeric Complex 500 MG Caps Generic drug: Turmeric Take 1 capsule by mouth at bedtime.   QUERCETIN COMPLEX IMMUNE PO Take 1 capsule by mouth in the morning and at bedtime.   THIAMINE PO Take 1 capsule by mouth daily.   VITAMIN D-VITAMIN K PO Take 1 tablet by mouth daily.        Follow-up Information     Carlie Clark, MD Follow up in 1 week(s).   Specialty: Otolaryngology Contact information: [address] [place] [address] [phone]                 Signed: Clark Carlie 07/03/2024, 2:34 PM

## 2024-07-03 NOTE — Plan of Care (Signed)
  Problem: Education: Goal: Knowledge of General Education information will improve Description: Including pain rating scale, medication(s)/side effects and non-pharmacologic comfort measures Outcome: Progressing   Problem: Coping: Goal: Level of anxiety will decrease Outcome: Progressing   Problem: Skin Integrity: Goal: Risk for impaired skin integrity will decrease Outcome: Progressing   Problem: Pain Managment: Goal: General experience of comfort will improve and/or be controlled Outcome: Progressing

## 2024-07-05 LAB — SURGICAL PATHOLOGY
